# Patient Record
Sex: Female | Born: 1960 | Race: White | Hispanic: No | Smoking: Current every day smoker
Health system: Southern US, Community
[De-identification: ages and names within clinical notes are randomized; demographics above are authoritative.]

## PROBLEM LIST (undated history)

## (undated) DIAGNOSIS — F4310 Post-traumatic stress disorder, unspecified: Secondary | ICD-10-CM

## (undated) DIAGNOSIS — R748 Abnormal levels of other serum enzymes: Secondary | ICD-10-CM

## (undated) DIAGNOSIS — N183 Chronic kidney disease, stage 3 unspecified: Secondary | ICD-10-CM

## (undated) DIAGNOSIS — Z972 Presence of dental prosthetic device (complete) (partial): Secondary | ICD-10-CM

## (undated) DIAGNOSIS — Z1239 Encounter for other screening for malignant neoplasm of breast: Secondary | ICD-10-CM

## (undated) DIAGNOSIS — R011 Cardiac murmur, unspecified: Secondary | ICD-10-CM

## (undated) DIAGNOSIS — E785 Hyperlipidemia, unspecified: Secondary | ICD-10-CM

## (undated) DIAGNOSIS — F32A Depression, unspecified: Secondary | ICD-10-CM

## (undated) DIAGNOSIS — F329 Major depressive disorder, single episode, unspecified: Secondary | ICD-10-CM

## (undated) DIAGNOSIS — F410 Panic disorder [episodic paroxysmal anxiety] without agoraphobia: Secondary | ICD-10-CM

## (undated) DIAGNOSIS — J45909 Unspecified asthma, uncomplicated: Secondary | ICD-10-CM

## (undated) DIAGNOSIS — R0609 Other forms of dyspnea: Secondary | ICD-10-CM

## (undated) DIAGNOSIS — R072 Precordial pain: Secondary | ICD-10-CM

## (undated) DIAGNOSIS — H60399 Other infective otitis externa, unspecified ear: Secondary | ICD-10-CM

## (undated) DIAGNOSIS — K2940 Chronic atrophic gastritis without bleeding: Secondary | ICD-10-CM

## (undated) DIAGNOSIS — M25569 Pain in unspecified knee: Secondary | ICD-10-CM

## (undated) DIAGNOSIS — E669 Obesity, unspecified: Secondary | ICD-10-CM

## (undated) DIAGNOSIS — K219 Gastro-esophageal reflux disease without esophagitis: Secondary | ICD-10-CM

## (undated) DIAGNOSIS — R0602 Shortness of breath: Secondary | ICD-10-CM

## (undated) DIAGNOSIS — R7303 Prediabetes: Secondary | ICD-10-CM

## (undated) DIAGNOSIS — F411 Generalized anxiety disorder: Secondary | ICD-10-CM

## (undated) DIAGNOSIS — R55 Syncope and collapse: Secondary | ICD-10-CM

## (undated) DIAGNOSIS — I700 Atherosclerosis of aorta: Secondary | ICD-10-CM

## (undated) DIAGNOSIS — M79609 Pain in unspecified limb: Secondary | ICD-10-CM

## (undated) DIAGNOSIS — I10 Essential (primary) hypertension: Secondary | ICD-10-CM

## (undated) DIAGNOSIS — I340 Nonrheumatic mitral (valve) insufficiency: Secondary | ICD-10-CM

## (undated) DIAGNOSIS — R079 Chest pain, unspecified: Secondary | ICD-10-CM

## (undated) DIAGNOSIS — Z9889 Other specified postprocedural states: Secondary | ICD-10-CM

## (undated) HISTORY — DX: Cardiac murmur, unspecified: R01.1

## (undated) HISTORY — DX: Prediabetes: R73.03

## (undated) HISTORY — DX: Gastro-esophageal reflux disease without esophagitis: K21.9

## (undated) HISTORY — DX: Pain in unspecified knee: M25.569

## (undated) HISTORY — DX: Pain in unspecified limb: M79.609

## (undated) HISTORY — DX: Essential (primary) hypertension: I10

## (undated) HISTORY — DX: Major depressive disorder, single episode, unspecified: F32.9

## (undated) HISTORY — PX: SHOULDER SURGERY: SHX246

## (undated) HISTORY — DX: Generalized anxiety disorder: F41.1

## (undated) HISTORY — DX: Depression, unspecified: F32.A

## (undated) HISTORY — DX: Chronic kidney disease, stage 3 unspecified: N18.30

## (undated) HISTORY — DX: Unspecified asthma, uncomplicated: J45.909

## (undated) HISTORY — DX: Post-traumatic stress disorder, unspecified: F43.10

## (undated) HISTORY — DX: Atherosclerosis of aorta: I70.0

## (undated) HISTORY — DX: Encounter for other screening for malignant neoplasm of breast: Z12.39

## (undated) HISTORY — DX: Obesity, unspecified: E66.9

## (undated) HISTORY — DX: Shortness of breath: R06.02

## (undated) HISTORY — DX: Abnormal levels of other serum enzymes: R74.8

## (undated) HISTORY — DX: Syncope and collapse: R55

## (undated) HISTORY — DX: Chest pain, unspecified: R07.9

## (undated) HISTORY — DX: Other infective otitis externa, unspecified ear: H60.399

## (undated) HISTORY — DX: Chronic kidney disease, stage 3 (moderate): N18.3

## (undated) HISTORY — DX: Panic disorder (episodic paroxysmal anxiety): F41.0

## (undated) HISTORY — DX: Hyperlipidemia, unspecified: E78.5

## (undated) HISTORY — DX: Other specified postprocedural states: Z98.890

## (undated) HISTORY — PX: TOTAL ABDOMINAL HYSTERECTOMY: SHX209

## (undated) HISTORY — DX: Chronic atrophic gastritis without bleeding: K29.40

---

## 1998-04-03 HISTORY — PX: TOTAL ABDOMINAL HYSTERECTOMY: SHX209

## 2004-04-30 ENCOUNTER — Emergency Department: Payer: Self-pay | Admitting: Emergency Medicine

## 2006-12-10 ENCOUNTER — Emergency Department: Payer: Self-pay | Admitting: Emergency Medicine

## 2006-12-10 ENCOUNTER — Other Ambulatory Visit: Payer: Self-pay

## 2007-05-24 ENCOUNTER — Emergency Department: Payer: Self-pay | Admitting: Emergency Medicine

## 2008-08-06 ENCOUNTER — Ambulatory Visit: Payer: Self-pay | Admitting: Family Medicine

## 2009-01-05 ENCOUNTER — Ambulatory Visit: Payer: Self-pay

## 2009-12-17 ENCOUNTER — Emergency Department: Payer: Self-pay | Admitting: Emergency Medicine

## 2010-05-27 ENCOUNTER — Emergency Department: Payer: Self-pay | Admitting: Emergency Medicine

## 2010-06-01 ENCOUNTER — Ambulatory Visit: Payer: Self-pay | Admitting: Anesthesiology

## 2010-06-02 ENCOUNTER — Ambulatory Visit: Payer: Self-pay | Admitting: Orthopedic Surgery

## 2012-03-18 DIAGNOSIS — F32A Depression, unspecified: Secondary | ICD-10-CM | POA: Insufficient documentation

## 2012-03-18 DIAGNOSIS — F329 Major depressive disorder, single episode, unspecified: Secondary | ICD-10-CM | POA: Insufficient documentation

## 2012-03-18 DIAGNOSIS — I10 Essential (primary) hypertension: Secondary | ICD-10-CM

## 2012-03-18 DIAGNOSIS — J45909 Unspecified asthma, uncomplicated: Secondary | ICD-10-CM | POA: Insufficient documentation

## 2012-03-18 HISTORY — DX: Essential (primary) hypertension: I10

## 2012-04-04 DIAGNOSIS — B079 Viral wart, unspecified: Secondary | ICD-10-CM | POA: Insufficient documentation

## 2012-04-04 DIAGNOSIS — Z1239 Encounter for other screening for malignant neoplasm of breast: Secondary | ICD-10-CM

## 2012-04-04 DIAGNOSIS — R197 Diarrhea, unspecified: Secondary | ICD-10-CM | POA: Insufficient documentation

## 2012-04-04 HISTORY — DX: Encounter for other screening for malignant neoplasm of breast: Z12.39

## 2012-04-17 DIAGNOSIS — K130 Diseases of lips: Secondary | ICD-10-CM | POA: Insufficient documentation

## 2012-04-17 DIAGNOSIS — R21 Rash and other nonspecific skin eruption: Secondary | ICD-10-CM | POA: Insufficient documentation

## 2012-05-30 DIAGNOSIS — Z9889 Other specified postprocedural states: Secondary | ICD-10-CM

## 2012-05-30 HISTORY — DX: Other specified postprocedural states: Z98.890

## 2012-08-02 ENCOUNTER — Ambulatory Visit: Payer: Self-pay | Admitting: Nurse Practitioner

## 2013-08-14 ENCOUNTER — Encounter: Payer: Self-pay | Admitting: *Deleted

## 2013-08-15 ENCOUNTER — Encounter (INDEPENDENT_AMBULATORY_CARE_PROVIDER_SITE_OTHER): Payer: Self-pay

## 2013-08-15 ENCOUNTER — Ambulatory Visit: Payer: Medicaid Other | Admitting: Cardiovascular Disease

## 2013-08-28 ENCOUNTER — Ambulatory Visit: Payer: Self-pay | Admitting: Family Medicine

## 2013-09-03 LAB — HEMOGLOBIN A1C: Hgb A1c MFr Bld: 5.6 % (ref 4.0–6.0)

## 2013-10-16 ENCOUNTER — Encounter: Payer: Self-pay | Admitting: *Deleted

## 2013-10-17 ENCOUNTER — Encounter: Payer: Self-pay | Admitting: Cardiovascular Disease

## 2013-10-17 ENCOUNTER — Ambulatory Visit (INDEPENDENT_AMBULATORY_CARE_PROVIDER_SITE_OTHER): Payer: Medicaid Other | Admitting: Cardiovascular Disease

## 2013-10-17 ENCOUNTER — Encounter (INDEPENDENT_AMBULATORY_CARE_PROVIDER_SITE_OTHER): Payer: Self-pay

## 2013-10-17 VITALS — BP 110/74 | HR 71 | Ht 65.5 in | Wt 159.5 lb

## 2013-10-17 DIAGNOSIS — R011 Cardiac murmur, unspecified: Secondary | ICD-10-CM | POA: Insufficient documentation

## 2013-10-17 DIAGNOSIS — R079 Chest pain, unspecified: Secondary | ICD-10-CM

## 2013-10-17 DIAGNOSIS — Z720 Tobacco use: Secondary | ICD-10-CM | POA: Insufficient documentation

## 2013-10-17 DIAGNOSIS — E785 Hyperlipidemia, unspecified: Secondary | ICD-10-CM | POA: Insufficient documentation

## 2013-10-17 DIAGNOSIS — F17200 Nicotine dependence, unspecified, uncomplicated: Secondary | ICD-10-CM

## 2013-10-17 HISTORY — DX: Cardiac murmur, unspecified: R01.1

## 2013-10-17 NOTE — Assessment & Plan Note (Signed)
She has a cardiac murmur in the aortic area with no previous evaluation. I requested an echocardiogram.

## 2013-10-17 NOTE — Patient Instructions (Addendum)
[facility] MYOVIEW  Your caregiver has ordered a Stress Test with nuclear imaging. The purpose of this test is to evaluate the blood supply to your heart muscle. This procedure is referred to as a "Non-Invasive Stress Test." This is because other than having an IV started in your vein, nothing is inserted or "invades" your body. Cardiac stress tests are done to find areas of poor blood flow to the heart by determining the extent of coronary artery disease (CAD). Some patients exercise on a treadmill, which naturally increases the blood flow to your heart, while others who are  unable to walk on a treadmill due to physical limitations have a pharmacologic/chemical stress agent called Lexiscan . This medicine will mimic walking on a treadmill by temporarily increasing your coronary blood flow.   Please note: these test may take anywhere between 2-4 hours to complete  [facility]  THE VOLUNTEERS AT THE FIRST DESK WILL DIRECT YOU WHERE TO GO  Date of Procedure:_______07/22/15______________________________  Arrival Time for Procedure:_______0945 am_______________________      PLEASE NOTIFY THE OFFICE AT LEAST 24 HOURS IN ADVANCE IF YOU ARE UNABLE TO KEEP YOUR APPOINTMENT.  [phone] AND  PLEASE NOTIFY [facility] AT [facility] AT LEAST 24 HOURS IN ADVANCE IF YOU ARE UNABLE TO KEEP YOUR APPOINTMENT. [phone]  How to prepare for your Myoview test:  1. Do not eat or drink after midnight 2. No caffeine for 24 hours prior to test 3. No smoking 24 hours prior to test. 4. Your medication may be taken with water.  If your doctor stopped a medication because of this test, do not take that medication. 5. Ladies, please do not wear dresses.  Skirts or pants are appropriate. Please wear a short sleeve shirt. 6. No perfume, cologne or lotion. 7. Wear comfortable walking shoes. No heels!   Your physician has requested that you have an echocardiogram. Echocardiography is a  painless test that uses sound waves to create images of your heart. It provides your doctor with information about the size and shape of your heart and how well your heart's chambers and valves are working. This procedure takes approximately one hour. There are no restrictions for this procedure.  Your physician recommends that you schedule a follow-up appointment in:  After your tests with Dr. Kirke CorinArida

## 2013-10-17 NOTE — Progress Notes (Signed)
Primary care physician: Dr. Brayton ElSyed Shah  HPI  This is a pleasant 53 year old female who was referred for evaluation of chest pain. She has no previous cardiac history. She has no history of diabetes or hypertension. She reports severe hyperlipidemia which has been mostly untreated. She also smokes one pack per day. She has strong family history of premature coronary artery disease. She has been experiencing substernal aching sensation with no radiation. This is usually left sided. It has been associated with right arm tingling. It initially happened at night. However, she had recent episodes while she was mowing the lawn. She noticed worsening exertional dyspnea. She was started on atorvastatin recently but stopped the medication on her own due to very mild myalgia and concerns about side effects.  Allergies  Allergen Reactions  . Penicillins   . Sulfa Antibiotics      Current Outpatient Prescriptions on File Prior to Visit  Medication Sig Dispense Refill  . albuterol (PROVENTIL HFA;VENTOLIN HFA) 108 (90 BASE) MCG/ACT inhaler Inhale 1 puff into the lungs every 6 (six) hours as needed for wheezing or shortness of breath.      . ALPRAZolam (XANAX) 1 MG tablet Take 1.5 mg by mouth as needed.       . citalopram (CELEXA) 40 MG tablet Take 40 mg by mouth daily.      . mirtazapine (REMERON) 45 MG tablet Take 45 mg by mouth at bedtime.      Marland Kitchen. omeprazole (PRILOSEC) 40 MG capsule Take 40 mg by mouth daily.       No current facility-administered medications on file prior to visit.     Past Medical History  Diagnosis Date  . Depression   . Anxiety state, unspecified   . Other and unspecified hyperlipidemia   . Chronic kidney disease, stage III (moderate)   . Other nonspecific abnormal serum enzyme levels   . Undiagnosed cardiac murmurs   . Obesity, unspecified   . Infective otitis externa, unspecified   . 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 Pain in joint, lower leg   . Pain  in limb   . Panic disorder without agoraphobia   . Posttraumatic stress disorder   . Shortness of breath   . Chest pain, unspecified   . Anxiety state, unspecified   . Syncope and collapse   . Asthma      Past Surgical History  Procedure Laterality Date  . Total abdominal hysterectomy    . Shoulder surgery       Family History  Problem Relation Age of Onset  . Hypertension Mother   . COPD Mother   . Heart attack Mother   . Stroke Mother   . Heart disease Mother   . Hypertension Father   . Bone cancer Father   . Heart attack Father   . Stroke Father   . Heart disease Father   . Thyroid disease Sister      History   Social History  . Marital Status: Divorced    Spouse Name: N/A    Number of Children: N/A  . Years of Education: N/A   Occupational History  . Not on file.   Social History Main Topics  . Smoking status: Current Every Day Smoker -- 1.50 packs/day for 30 years    Types: Cigarettes  . Smokeless tobacco: Not on file  . Alcohol Use: No  . Drug Use: No  . Sexual Activity: Not on file   Other Topics Concern  . Not  on file   Social History Narrative  . No narrative on file     ROS A 10 point review of system was performed. It is negative other than that mentioned in the history of present illness.   PHYSICAL EXAM   BP 110/74  Pulse 71  Ht 5' 5.5" (1.664 m)  Wt 159 lb 8 oz (72.349 kg)  BMI 26.13 kg/m2 Constitutional: She is oriented to person, place, and time. She appears well-developed and well-nourished. No distress.  HENT: No nasal discharge.  Head: Normocephalic and atraumatic.  Eyes: Pupils are equal and round. No discharge.  Neck: Normal range of motion. Neck supple. No JVD present. No thyromegaly present.  Cardiovascular: Normal rate, regular rhythm, normal heart sounds. Exam reveals no gallop and no friction rub. There is a 2/6 systolic ejection murmur in the aortic area  Pulmonary/Chest: Effort normal and breath sounds normal.  No stridor. No respiratory distress. She has no wheezes. She has no rales. She exhibits no tenderness.  Abdominal: Soft. Bowel sounds are normal. She exhibits no distension. There is no tenderness. There is no rebound and no guarding.  Musculoskeletal: Normal range of motion. She exhibits no edema and no tenderness.  Neurological: She is alert and oriented to person, place, and time. Coordination normal.  Skin: Skin is warm and dry. No rash noted. She is not diaphoretic. No erythema. No pallor.  Psychiatric: She has a normal mood and affect. Her behavior is normal. Judgment and thought content normal.     EKG: Normal sinus rhythm with nonspecific anterior T wave changes   ASSESSMENT AND PLAN

## 2013-10-17 NOTE — Assessment & Plan Note (Signed)
She is going to resume taking atorvastatin. If she is not able to tolerate this, then I recommend rosuvastatin.

## 2013-10-17 NOTE — Assessment & Plan Note (Signed)
She is having chest pain with some anginal and some atypical features. She has multiple risk factors for coronary artery disease. Baseline ECG shows flat T waves in the anterior leads. I recommend evaluation with a treadmill nuclear stress test. I discussed with the patient the importance of lifestyle changes in order to decrease the chance of future coronary artery disease and cardiovascular events. We discussed the importance of controlling risk factors, healthy diet as well as regular exercise. I also explained to him that a normal stress test does not rule out atherosclerosis.

## 2013-10-17 NOTE — Assessment & Plan Note (Signed)
I strongly advised her to quit smoking. 

## 2013-10-22 ENCOUNTER — Ambulatory Visit: Payer: Self-pay | Admitting: Cardiovascular Disease

## 2013-10-22 DIAGNOSIS — R079 Chest pain, unspecified: Secondary | ICD-10-CM

## 2013-10-23 ENCOUNTER — Other Ambulatory Visit: Payer: Self-pay

## 2013-10-23 DIAGNOSIS — R079 Chest pain, unspecified: Secondary | ICD-10-CM

## 2013-11-04 ENCOUNTER — Ambulatory Visit: Payer: Medicaid Other | Admitting: Cardiovascular Disease

## 2013-11-18 ENCOUNTER — Other Ambulatory Visit (INDEPENDENT_AMBULATORY_CARE_PROVIDER_SITE_OTHER): Payer: Medicare Other

## 2013-11-18 ENCOUNTER — Other Ambulatory Visit: Payer: Self-pay

## 2013-11-18 DIAGNOSIS — R079 Chest pain, unspecified: Secondary | ICD-10-CM

## 2013-11-18 DIAGNOSIS — R011 Cardiac murmur, unspecified: Secondary | ICD-10-CM | POA: Diagnosis not present

## 2014-01-20 DIAGNOSIS — Z23 Encounter for immunization: Secondary | ICD-10-CM | POA: Diagnosis not present

## 2014-01-20 DIAGNOSIS — E785 Hyperlipidemia, unspecified: Secondary | ICD-10-CM | POA: Diagnosis not present

## 2014-01-20 DIAGNOSIS — Z1389 Encounter for screening for other disorder: Secondary | ICD-10-CM | POA: Diagnosis not present

## 2014-02-12 DIAGNOSIS — F4310 Post-traumatic stress disorder, unspecified: Secondary | ICD-10-CM | POA: Diagnosis not present

## 2014-04-07 DIAGNOSIS — F4310 Post-traumatic stress disorder, unspecified: Secondary | ICD-10-CM | POA: Diagnosis not present

## 2014-05-05 DIAGNOSIS — F4310 Post-traumatic stress disorder, unspecified: Secondary | ICD-10-CM | POA: Diagnosis not present

## 2014-05-11 DIAGNOSIS — F4310 Post-traumatic stress disorder, unspecified: Secondary | ICD-10-CM | POA: Diagnosis not present

## 2014-06-04 DIAGNOSIS — F4310 Post-traumatic stress disorder, unspecified: Secondary | ICD-10-CM | POA: Diagnosis not present

## 2014-06-05 DIAGNOSIS — E785 Hyperlipidemia, unspecified: Secondary | ICD-10-CM | POA: Diagnosis not present

## 2014-06-05 LAB — LIPID PANEL
Cholesterol: 266 mg/dL — AB (ref 0–200)
HDL: 35 mg/dL (ref 35–70)
LDL Cholesterol: 182 mg/dL
Triglycerides: 244 mg/dL — AB (ref 40–160)

## 2014-07-28 DIAGNOSIS — F4310 Post-traumatic stress disorder, unspecified: Secondary | ICD-10-CM | POA: Diagnosis not present

## 2014-08-17 DIAGNOSIS — F4310 Post-traumatic stress disorder, unspecified: Secondary | ICD-10-CM | POA: Diagnosis not present

## 2014-09-01 DIAGNOSIS — F4310 Post-traumatic stress disorder, unspecified: Secondary | ICD-10-CM | POA: Diagnosis not present

## 2014-09-08 ENCOUNTER — Ambulatory Visit: Payer: Self-pay | Admitting: Family Medicine

## 2014-09-16 ENCOUNTER — Ambulatory Visit (INDEPENDENT_AMBULATORY_CARE_PROVIDER_SITE_OTHER): Payer: Medicare Other | Admitting: Family Medicine

## 2014-09-16 ENCOUNTER — Encounter: Payer: Self-pay | Admitting: Family Medicine

## 2014-09-16 VITALS — BP 110/60 | HR 96 | Resp 16 | Ht 64.5 in | Wt 161.6 lb

## 2014-09-16 DIAGNOSIS — K219 Gastro-esophageal reflux disease without esophagitis: Secondary | ICD-10-CM | POA: Insufficient documentation

## 2014-09-16 DIAGNOSIS — J4520 Mild intermittent asthma, uncomplicated: Secondary | ICD-10-CM | POA: Diagnosis not present

## 2014-09-16 DIAGNOSIS — F4312 Post-traumatic stress disorder, chronic: Secondary | ICD-10-CM | POA: Insufficient documentation

## 2014-09-16 DIAGNOSIS — R748 Abnormal levels of other serum enzymes: Secondary | ICD-10-CM | POA: Insufficient documentation

## 2014-09-16 DIAGNOSIS — K449 Diaphragmatic hernia without obstruction or gangrene: Secondary | ICD-10-CM | POA: Insufficient documentation

## 2014-09-16 DIAGNOSIS — E785 Hyperlipidemia, unspecified: Secondary | ICD-10-CM | POA: Diagnosis not present

## 2014-09-16 DIAGNOSIS — F419 Anxiety disorder, unspecified: Secondary | ICD-10-CM | POA: Insufficient documentation

## 2014-09-16 HISTORY — DX: Gastro-esophageal reflux disease without esophagitis: K21.9

## 2014-09-16 MED ORDER — ALBUTEROL SULFATE HFA 108 (90 BASE) MCG/ACT IN AERS: 2.0000 | INHALATION_SPRAY | Freq: Four times a day (QID) | RESPIRATORY_TRACT | Status: DC | PRN

## 2014-09-16 NOTE — Progress Notes (Signed)
Name: Ann Pena   MRN: [id number]    DOB: May 04, 1960   Date:09/16/2014       Progress Note  Subjective  Chief Complaint  Chief Complaint  Patient presents with  . Follow-up    3 month follow up  . Hyperlipidemia    Hyperlipidemia This is a chronic problem. The current episode started more than 1 year ago. The problem is uncontrolled. Recent lipid tests were reviewed and are high. Associated symptoms include leg pain and myalgias. Pertinent negatives include no chest pain. Current antihyperlipidemic treatment includes statins. Compliance problems include medication side effects (not taking Crestor due to adverse effects of fatigue  and sluggishness with myalgias.).  Risk factors for coronary artery disease include dyslipidemia.      Past Medical History  Diagnosis Date  . Depression   . Anxiety state, unspecified   . Chronic kidney disease, stage III (moderate)   . Other nonspecific abnormal serum enzyme levels   . Undiagnosed cardiac murmurs   . Obesity, unspecified   . Infective otitis externa, unspecified   . 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 Pain in joint, lower leg   . Pain in limb   . Panic disorder without agoraphobia   . Posttraumatic stress disorder   . Shortness of breath   . Chest pain, unspecified   . Anxiety state, unspecified   . Syncope and collapse   . Asthma   . Other and unspecified hyperlipidemia     Past Surgical History  Procedure Laterality Date  . Total abdominal hysterectomy    . Shoulder surgery      Family History  Problem Relation Age of Onset  . Hypertension Mother   . COPD Mother   . Heart attack Mother   . Stroke Mother   . Heart disease Mother   . Hypertension Father   . Bone cancer Father   . Heart attack Father   . Stroke Father   . Heart disease Father   . Thyroid disease Sister     History   Social History  . Marital Status: Divorced    Spouse Name: N/A  . Number of Children: N/A  . Years  of Education: N/A   Occupational History  . Not on file.   Social History Main Topics  . Smoking status: Current Every Day Smoker -- 1.50 packs/day for 30 years    Types: Cigarettes  . Smokeless tobacco: Not on file  . Alcohol Use: No  . Drug Use: No  . Sexual Activity: Not on file   Other Topics Concern  . Not on file   Social History Narrative     Current outpatient prescriptions:  .  albuterol (PROAIR HFA) 108 (90 BASE) MCG/ACT inhaler, Inhale into the lungs as needed., Disp: , Rfl:  .  ALPRAZolam (NIRAVAM) 1 MG dissolvable tablet, Take 1.5 tablets by mouth 4 (four) times daily., Disp: , Rfl:  .  citalopram (CELEXA) 40 MG tablet, Take 1 tablet by mouth daily., Disp: , Rfl:  .  omeprazole (PRILOSEC) 40 MG capsule, Take 1 capsule by mouth daily., Disp: , Rfl:  .  rosuvastatin (CRESTOR) 20 MG tablet, Take 1 tablet by mouth daily., Disp: , Rfl:  .  atorvastatin (LIPITOR) 20 MG tablet, Take 20 mg by mouth daily., Disp: , Rfl:  .  mirtazapine (REMERON) 45 MG tablet, Take 1 tablet by mouth at bedtime., Disp: , Rfl:   Allergies  Allergen Reactions  . Penicillins   .  Sulfa Antibiotics      Review of Systems  Constitutional: Positive for malaise/fatigue.  Cardiovascular: Negative for chest pain.  Musculoskeletal: Positive for myalgias.     Objective  Filed Vitals:   09/16/14 0904  BP: 110/60  Pulse: 96  Resp: 16  Height: 5' 4.5" (1.638 m)  Weight: 161 lb 9.6 oz (73.301 kg)  SpO2: 96%    Physical Exam  Constitutional: She is oriented to person, place, and time and well-developed, well-nourished, and in no distress.  HENT:  Head: Normocephalic and atraumatic.  Cardiovascular: Normal rate and regular rhythm.   Pulmonary/Chest: Effort normal. She has wheezes in the right lower field and the left lower field.  Neurological: She is alert and oriented to person, place, and time.  Psychiatric: Memory, affect and judgment normal.  Nursing note and vitals  reviewed.      No results found for this or any previous visit (from the past 2160 hour(s)).   Assessment & Plan 1. Dyslipidemia We will consider starting her on Zetia  daily after checking her Lipid Panel. - Lipid Profile  2. Airway hyperreactivity, mild intermittent, uncomplicated Recommended pt. To use ProAir as directed for wheezing. She has never been worked up for COPD. If her symptoms persist, we will consider referral to Pulmonology for evaluation. - albuterol (PROAIR HFA) 108 (90 BASE) MCG/ACT inhaler; Inhale 2 puffs into the lungs every 6 (six) hours as needed.  Dispense: 6.7 g; Refill: 1     There are no diagnoses linked to this encounter.  Ventura Hollenbeck Asad A[facility] 09/16/2014 9:29 AM

## 2014-09-17 LAB — LIPID PANEL
Chol/HDL Ratio: 8.7 ratio units — ABNORMAL HIGH (ref 0.0–4.4)
Cholesterol, Total: 286 mg/dL — ABNORMAL HIGH (ref 100–199)
HDL: 33 mg/dL — ABNORMAL LOW (ref >39)
LDL Calculated: 195 mg/dL — ABNORMAL HIGH (ref 0–99)
Triglycerides: 290 mg/dL — ABNORMAL HIGH (ref 0–149)
VLDL Cholesterol Cal: 58 mg/dL — ABNORMAL HIGH (ref 5–40)

## 2014-09-18 MED ORDER — EZETIMIBE 10 MG PO TABS
10.0000 mg | ORAL_TABLET | Freq: Every day | ORAL | Status: DC
Start: 2014-09-18 — End: 2015-04-27

## 2014-09-18 NOTE — Addendum Note (Signed)
Addended bySherryll Burger, Shevon Sian A A on: 09/18/2014 12:02 PM   Modules accepted: Orders, SmartSet

## 2014-10-01 DIAGNOSIS — F4310 Post-traumatic stress disorder, unspecified: Secondary | ICD-10-CM | POA: Diagnosis not present

## 2014-12-18 ENCOUNTER — Ambulatory Visit: Payer: Medicare Other | Admitting: Family Medicine

## 2015-01-01 DIAGNOSIS — F4310 Post-traumatic stress disorder, unspecified: Secondary | ICD-10-CM | POA: Diagnosis not present

## 2015-01-29 DIAGNOSIS — F4310 Post-traumatic stress disorder, unspecified: Secondary | ICD-10-CM | POA: Diagnosis not present

## 2015-03-02 DIAGNOSIS — F4310 Post-traumatic stress disorder, unspecified: Secondary | ICD-10-CM | POA: Diagnosis not present

## 2015-03-22 DIAGNOSIS — F322 Major depressive disorder, single episode, severe without psychotic features: Secondary | ICD-10-CM | POA: Diagnosis not present

## 2015-04-27 ENCOUNTER — Ambulatory Visit (INDEPENDENT_AMBULATORY_CARE_PROVIDER_SITE_OTHER): Payer: Medicare Other | Admitting: Family Medicine

## 2015-04-27 ENCOUNTER — Encounter: Payer: Self-pay | Admitting: Family Medicine

## 2015-04-27 VITALS — BP 110/77 | HR 101 | Temp 98.1°F | Resp 20 | Ht 65.0 in | Wt 152.0 lb

## 2015-04-27 DIAGNOSIS — K219 Gastro-esophageal reflux disease without esophagitis: Secondary | ICD-10-CM

## 2015-04-27 DIAGNOSIS — E785 Hyperlipidemia, unspecified: Secondary | ICD-10-CM | POA: Diagnosis not present

## 2015-04-27 MED ORDER — OMEPRAZOLE 40 MG PO CPDR: 40.0000 mg | DELAYED_RELEASE_CAPSULE | Freq: Every day | ORAL | Status: DC

## 2015-04-27 MED ORDER — EZETIMIBE 10 MG PO TABS: 10.0000 mg | ORAL_TABLET | Freq: Every day | ORAL | Status: DC

## 2015-04-27 NOTE — Progress Notes (Signed)
Name: Ann Pena   MRN: [id number]    DOB: [birth year]   Date:04/27/2015       Progress Note  Subjective  Chief Complaint  Chief Complaint  Patient presents with  . Follow-up    cholesterol check  . Hyperlipidemia  . Asthma    Hyperlipidemia This is a chronic problem. The problem is uncontrolled. Recent lipid tests were reviewed and are high. Pertinent negatives include no chest pain or shortness of breath. Current antihyperlipidemic treatment includes ezetimibe (Zetia 10 mg daily). Compliance problems: Ran out of Zetia, has not taken in over 6 months.   Gastroesophageal Reflux She reports no abdominal pain, no belching, no chest pain, no coughing, no dysphagia, no heartburn or no sore throat. This is a chronic problem. The problem has been unchanged. The symptoms are aggravated by certain foods (chocolate, spicy foods, greasy foods). She has tried a PPI for the symptoms. The treatment provided significant relief.    Past Medical History  Diagnosis Date  . Depression   . Anxiety state, unspecified   . Chronic kidney disease, stage III (moderate)   . Other nonspecific abnormal serum enzyme levels   . Undiagnosed cardiac murmurs   . Obesity, unspecified   . Infective otitis externa, unspecified   . 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 Pain in joint, lower leg   . Pain in limb   . Panic disorder without agoraphobia   . Posttraumatic stress disorder   . Shortness of breath   . Chest pain, unspecified   . Anxiety state, unspecified   . Syncope and collapse   . Asthma   . Other and unspecified hyperlipidemia     Past Surgical History  Procedure Laterality Date  . Total abdominal hysterectomy    . Shoulder surgery      Family History  Problem Relation Age of Onset  . Hypertension Mother   . COPD Mother   . Heart attack Mother   . Stroke Mother   . Heart disease Mother   . Hypertension Father   . Bone cancer Father   . Heart attack Father   .  Stroke Father   . Heart disease Father   . Thyroid disease Sister     Social History   Social History  . Marital Status: Divorced    Spouse Name: N/A  . Number of Children: N/A  . Years of Education: N/A   Occupational History  . Not on file.   Social History Main Topics  . Smoking status: Current Every Day Smoker -- 1.50 packs/day for 30 years    Types: Cigarettes  . Smokeless tobacco: Not on file  . Alcohol Use: No  . Drug Use: No  . Sexual Activity: Not on file   Other Topics Concern  . Not on file   Social History Narrative     Current outpatient prescriptions:  .  albuterol (PROAIR HFA) 108 (90 BASE) MCG/ACT inhaler, Inhale 2 puffs into the lungs every 6 (six) hours as needed., Disp: 6.7 g, Rfl: 1 .  ALPRAZolam (NIRAVAM) 1 MG dissolvable tablet, Take 1.5 tablets by mouth 4 (four) times daily., Disp: , Rfl:  .  ALPRAZolam (XANAX) 1 MG tablet, , Disp: , Rfl:  .  citalopram (CELEXA) 40 MG tablet, Take 1 tablet by mouth daily., Disp: , Rfl:  .  ezetimibe (ZETIA) 10 MG tablet, Take 1 tablet (10 mg total) by mouth daily., Disp: 90 tablet, Rfl: 3 .  mirtazapine (REMERON) 45  MG tablet, Take 1 tablet by mouth at bedtime., Disp: , Rfl:  .  omeprazole (PRILOSEC) 40 MG capsule, Take 1 capsule by mouth daily., Disp: , Rfl:   Allergies  Allergen Reactions  . Penicillins   . Sulfa Antibiotics     Review of Systems  HENT: Negative for sore throat.   Respiratory: Negative for cough and shortness of breath.   Cardiovascular: Negative for chest pain.  Gastrointestinal: Positive for diarrhea. Negative for heartburn, dysphagia, abdominal pain and blood in stool.     Objective  Filed Vitals:   04/27/15 0925  BP: 110/77  Pulse: 101  Temp: 98.1 F (36.7 C)  TempSrc: Oral  Resp: 20  Height:  (1.651 m)  Weight: 152 lb (68.947 kg)  SpO2: 96%    Physical Exam  Constitutional: She is oriented to person, place, and time and well-developed, well-nourished, and in no  distress.  HENT:  Head: Normocephalic and atraumatic.  Cardiovascular: Normal rate and regular rhythm.   No murmur heard. Pulmonary/Chest: Effort normal and breath sounds normal.  Neurological: She is alert and oriented to person, place, and time.  Psychiatric: Mood, memory, affect and judgment normal.  Nursing note and vitals reviewed.   Assessment & Plan  1. HLD (hyperlipidemia)  - Lipid Profile - ezetimibe (ZETIA) 10 MG tablet; Take 1 tablet (10 mg total) by mouth daily.  Dispense: 90 tablet; Refill: 1 - Lipid Profile - Comprehensive Metabolic Panel (CMET)  2. Gastroesophageal reflux disease, esophagitis presence not specified  - omeprazole (PRILOSEC) 40 MG capsule; Take 1 capsule (40 mg total) by mouth daily.  Dispense: 90 capsule; Refill: 0   Pearla Mckinny Asad [facility] 04/27/2015 9:53 AM

## 2015-04-28 LAB — LIPID PANEL
Chol/HDL Ratio: 7.3 ratio units — ABNORMAL HIGH (ref 0.0–4.4)
Cholesterol, Total: 279 mg/dL — ABNORMAL HIGH (ref 100–199)
HDL: 38 mg/dL — ABNORMAL LOW (ref >39)
LDL Calculated: 212 mg/dL — ABNORMAL HIGH (ref 0–99)
Triglycerides: 147 mg/dL (ref 0–149)
VLDL Cholesterol Cal: 29 mg/dL (ref 5–40)

## 2015-04-28 LAB — COMPREHENSIVE METABOLIC PANEL
ALT: 6 IU/L (ref 0–32)
AST: 15 IU/L (ref 0–40)
Albumin/Globulin Ratio: 1.5 (ref 1.1–2.5)
Albumin: 4.3 g/dL (ref 3.5–5.5)
Alkaline Phosphatase: 126 IU/L — ABNORMAL HIGH (ref 39–117)
BUN/Creatinine Ratio: 8 — ABNORMAL LOW (ref 9–23)
BUN: 8 mg/dL (ref 6–24)
Bilirubin Total: 0.3 mg/dL (ref 0.0–1.2)
CO2: 26 mmol/L (ref 18–29)
Calcium: 9.5 mg/dL (ref 8.7–10.2)
Chloride: 99 mmol/L (ref 96–106)
Creatinine, Ser: 0.99 mg/dL (ref 0.57–1.00)
GFR calc Af Amer: 75 mL/min/{1.73_m2} (ref >59)
GFR calc non Af Amer: 65 mL/min/{1.73_m2} (ref >59)
Globulin, Total: 2.8 g/dL (ref 1.5–4.5)
Glucose: 89 mg/dL (ref 65–99)
Potassium: 4.3 mmol/L (ref 3.5–5.2)
Sodium: 140 mmol/L (ref 134–144)
Total Protein: 7.1 g/dL (ref 6.0–8.5)

## 2015-05-10 ENCOUNTER — Telehealth: Payer: Self-pay | Admitting: Family Medicine

## 2015-05-10 NOTE — Telephone Encounter (Signed)
Pt states she got labs done a little over a week ago and has not gotten her results yet. Please advise pt.

## 2015-05-11 NOTE — Telephone Encounter (Signed)
Returned patient's and labs have been re-reported to patient

## 2015-05-13 DIAGNOSIS — F4310 Post-traumatic stress disorder, unspecified: Secondary | ICD-10-CM | POA: Diagnosis not present

## 2015-05-26 ENCOUNTER — Encounter: Payer: Self-pay | Admitting: Family Medicine

## 2015-05-26 ENCOUNTER — Ambulatory Visit (INDEPENDENT_AMBULATORY_CARE_PROVIDER_SITE_OTHER): Payer: Medicare Other | Admitting: Family Medicine

## 2015-05-26 ENCOUNTER — Ambulatory Visit
Admission: RE | Admit: 2015-05-26 | Discharge: 2015-05-26 | Disposition: A | Discharge status: Home / Self Care | Payer: Medicare Other | Source: Ambulatory Visit | Attending: Family Medicine | Admitting: Family Medicine

## 2015-05-26 VITALS — BP 112/84 | HR 106 | Temp 98.1°F | Resp 16 | Ht 65.0 in | Wt 152.8 lb

## 2015-05-26 DIAGNOSIS — S069X0A Unspecified intracranial injury without loss of consciousness, initial encounter: Secondary | ICD-10-CM | POA: Diagnosis not present

## 2015-05-26 DIAGNOSIS — R51 Headache: Secondary | ICD-10-CM | POA: Diagnosis not present

## 2015-05-26 DIAGNOSIS — H9313 Tinnitus, bilateral: Secondary | ICD-10-CM | POA: Diagnosis not present

## 2015-05-26 DIAGNOSIS — G44319 Acute post-traumatic headache, not intractable: Secondary | ICD-10-CM | POA: Diagnosis not present

## 2015-05-26 DIAGNOSIS — H9319 Tinnitus, unspecified ear: Secondary | ICD-10-CM | POA: Insufficient documentation

## 2015-05-26 NOTE — Progress Notes (Signed)
Name: Ann Pena   MRN: [id number]    DOB: 1961-02-01   Date:05/26/2015       Progress Note  Subjective  Chief Complaint  Chief Complaint  Patient presents with  . Head Injury    Hit head on cabinet 1 week ago, Dizzy, ringing in ears, seen spots  out of corner of eyes with headache    Head Injury  Incident onset: 11 days ago. Injury mechanism: Hit her head on a wooden cabinet while picking up an object. There was no loss of consciousness. There was no blood loss. The quality of the pain is described as sharp (had a sharp headache every day since the incident). Associated symptoms include headaches and tinnitus (initially dizziness last week, now experiencing 'roaring' in her ears.). Pertinent negatives include no blurred vision (experienced 'raindrops' from her left eye) or disorientation. She has tried acetaminophen for the symptoms. The treatment provided moderate relief.    Past Medical History  Diagnosis Date  . Depression   . Anxiety state, unspecified   . Chronic kidney disease, stage III (moderate)   . Other nonspecific abnormal serum enzyme levels   . Undiagnosed cardiac murmurs   . Obesity, unspecified   . Infective otitis externa, unspecified   . 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 Pain in joint, lower leg   . Pain in limb   . Panic disorder without agoraphobia   . Posttraumatic stress disorder   . Shortness of breath   . Chest pain, unspecified   . Anxiety state, unspecified   . Syncope and collapse   . Asthma   . Other and unspecified hyperlipidemia     Past Surgical History  Procedure Laterality Date  . Total abdominal hysterectomy    . Shoulder surgery      Family History  Problem Relation Age of Onset  . Hypertension Mother   . COPD Mother   . Heart attack Mother   . Stroke Mother   . Heart disease Mother   . Hypertension Father   . Bone cancer Father   . Heart attack Father   . Stroke Father   . Heart disease Father   .  Thyroid disease Sister     Social History   Social History  . Marital Status: Divorced    Spouse Name: N/A  . Number of Children: N/A  . Years of Education: N/A   Occupational History  . Not on file.   Social History Main Topics  . Smoking status: Current Every Day Smoker -- 1.50 packs/day for 30 years    Types: Cigarettes  . Smokeless tobacco: Not on file  . Alcohol Use: No  . Drug Use: No  . Sexual Activity: Not on file   Other Topics Concern  . Not on file   Social History Narrative     Current outpatient prescriptions:  .  albuterol (PROAIR HFA) 108 (90 BASE) MCG/ACT inhaler, Inhale 2 puffs into the lungs every 6 (six) hours as needed., Disp: 6.7 g, Rfl: 1 .  ALPRAZolam (XANAX) 1 MG tablet, , Disp: , Rfl:  .  citalopram (CELEXA) 40 MG tablet, Take 1 tablet by mouth daily., Disp: , Rfl:  .  ezetimibe (ZETIA) 10 MG tablet, Take 1 tablet (10 mg total) by mouth daily., Disp: 90 tablet, Rfl: 1 .  mirtazapine (REMERON) 45 MG tablet, Take 1 tablet by mouth at bedtime., Disp: , Rfl:  .  omeprazole (PRILOSEC) 40 MG capsule, Take 1 capsule (  40 mg total) by mouth daily., Disp: 90 capsule, Rfl: 0  Allergies  Allergen Reactions  . Penicillins   . Sulfa Antibiotics      Review of Systems  HENT: Positive for tinnitus (initially dizziness last week, now experiencing 'roaring' in her ears.). Negative for ear pain and hearing loss.   Eyes: Negative for blurred vision (experienced 'raindrops' from her left eye) and double vision.  Neurological: Positive for headaches. Negative for dizziness.    Objective  Filed Vitals:   05/26/15 1101  BP: 112/84  Pulse: 106  Temp: 98.1 F (36.7 C)  TempSrc: Oral  Resp: 16  Height:  (1.651 m)  Weight: 152 lb 12.8 oz (69.31 kg)  SpO2: 97%    Physical Exam  Constitutional: She is oriented to person, place, and time and well-developed, well-nourished, and in no distress.  HENT:  Head: Normocephalic and atraumatic. Head is  without contusion.  Right Ear: Tympanic membrane, external ear and ear canal normal.  Left Ear: Tympanic membrane, external ear and ear canal normal.  Mouth/Throat: Posterior oropharyngeal erythema present.  TM normal, canal clear, non erythematous, however pt. Experienced pain and 'ocean waves' in both ears during otoscopic exam.  Eyes: EOM are normal. Pupils are equal, round, and reactive to light.  Cardiovascular: Normal rate, regular rhythm, S1 normal and S2 normal.   Pulmonary/Chest: Effort normal and breath sounds normal.  Neurological: She is alert and oriented to person, place, and time. She has intact cranial nerves. No cranial nerve deficit.  Patient was not able to hear a finger rub in both ears, however, she was able to hear my voice and converse normally during the exam.  Nursing note and vitals reviewed.   Assessment & Plan  1. Acute post-traumatic headache, not intractable Headache improving, and obtain CT to rule out any acute process especially in light of auditory and visual symptoms experienced by the patient. - CT Head Wo Contrast; Future  2. Tinnitus, subjective, bilateral Ear canals and TMs are normal, will refer to ENT for further evaluation - Ambulatory referral to ENT   Annibelle Brazie Asad A. [facility] 05/26/2015 11:17 AM

## 2015-06-03 DIAGNOSIS — F4310 Post-traumatic stress disorder, unspecified: Secondary | ICD-10-CM | POA: Diagnosis not present

## 2015-06-10 DIAGNOSIS — H903 Sensorineural hearing loss, bilateral: Secondary | ICD-10-CM | POA: Diagnosis not present

## 2015-06-10 DIAGNOSIS — H9313 Tinnitus, bilateral: Secondary | ICD-10-CM | POA: Diagnosis not present

## 2015-07-08 DIAGNOSIS — F4310 Post-traumatic stress disorder, unspecified: Secondary | ICD-10-CM | POA: Diagnosis not present

## 2015-07-26 ENCOUNTER — Ambulatory Visit (INDEPENDENT_AMBULATORY_CARE_PROVIDER_SITE_OTHER): Payer: Medicare Other | Admitting: Family Medicine

## 2015-07-26 ENCOUNTER — Encounter: Payer: Self-pay | Admitting: Family Medicine

## 2015-07-26 VITALS — BP 110/78 | HR 92 | Temp 98.1°F | Resp 18 | Ht 65.0 in | Wt 160.9 lb

## 2015-07-26 DIAGNOSIS — K219 Gastro-esophageal reflux disease without esophagitis: Secondary | ICD-10-CM | POA: Diagnosis not present

## 2015-07-26 DIAGNOSIS — E785 Hyperlipidemia, unspecified: Secondary | ICD-10-CM | POA: Diagnosis not present

## 2015-07-26 MED ORDER — OMEPRAZOLE 40 MG PO CPDR: 40.0000 mg | DELAYED_RELEASE_CAPSULE | Freq: Every day | ORAL | Status: DC

## 2015-07-26 NOTE — Progress Notes (Signed)
Name: Ann Pena   MRN: [id number]    DOB: July 08, 1960   Date:07/26/2015       Progress Note  Subjective  Chief Complaint  Chief Complaint  Patient presents with  . Follow-up    3 mo    Hyperlipidemia This is a chronic problem. The problem is uncontrolled. Recent lipid tests were reviewed and are high. Pertinent negatives include no chest pain or shortness of breath. Current antihyperlipidemic treatment includes ezetimibe (Zetia 10 mg daily). There are no compliance problems.   Gastroesophageal Reflux She reports no abdominal pain, no belching, no chest pain, no coughing, no dysphagia, no heartburn or no sore throat. This is a chronic problem. The problem has been unchanged. The symptoms are aggravated by certain foods (chocolate, spicy foods, greasy foods). She has tried a PPI for the symptoms. The treatment provided significant relief.    Past Medical History  Diagnosis Date  . Depression   . Anxiety state, unspecified   . Chronic kidney disease, stage III (moderate)   . Other nonspecific abnormal serum enzyme levels   . Undiagnosed cardiac murmurs   . Obesity, unspecified   . Infective otitis externa, unspecified   . 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 Pain in joint, lower leg   . Pain in limb   . Panic disorder without agoraphobia   . Posttraumatic stress disorder   . Shortness of breath   . Chest pain, unspecified   . Anxiety state, unspecified   . Syncope and collapse   . Asthma   . Other and unspecified hyperlipidemia     Past Surgical History  Procedure Laterality Date  . Total abdominal hysterectomy    . Shoulder surgery      Family History  Problem Relation Age of Onset  . Hypertension Mother   . COPD Mother   . Heart attack Mother   . Stroke Mother   . Heart disease Mother   . Hypertension Father   . Bone cancer Father   . Heart attack Father   . Stroke Father   . Heart disease Father   . Thyroid disease Sister     Social  History   Social History  . Marital Status: Divorced    Spouse Name: N/A  . Number of Children: N/A  . Years of Education: N/A   Occupational History  . Not on file.   Social History Main Topics  . Smoking status: Current Every Day Smoker -- 1.50 packs/day for 30 years    Types: Cigarettes  . Smokeless tobacco: Not on file  . Alcohol Use: No  . Drug Use: No  . Sexual Activity: Not on file   Other Topics Concern  . Not on file   Social History Narrative     Current outpatient prescriptions:  .  albuterol (PROAIR HFA) 108 (90 BASE) MCG/ACT inhaler, Inhale 2 puffs into the lungs every 6 (six) hours as needed., Disp: 6.7 g, Rfl: 1 .  ALPRAZolam (XANAX) 1 MG tablet, , Disp: , Rfl:  .  citalopram (CELEXA) 40 MG tablet, Take 1 tablet by mouth daily., Disp: , Rfl:  .  ezetimibe (ZETIA) 10 MG tablet, Take 1 tablet (10 mg total) by mouth daily., Disp: 90 tablet, Rfl: 1 .  mirtazapine (REMERON) 45 MG tablet, Take 1 tablet by mouth at bedtime., Disp: , Rfl:  .  omeprazole (PRILOSEC) 40 MG capsule, Take 1 capsule (40 mg total) by mouth daily., Disp: 90 capsule, Rfl: 0  Allergies  Allergen Reactions  . Penicillins   . Sulfa Antibiotics      Review of Systems  HENT: Negative for sore throat.   Respiratory: Negative for cough and shortness of breath.   Cardiovascular: Negative for chest pain.  Gastrointestinal: Negative for heartburn, dysphagia and abdominal pain.    Objective  Filed Vitals:   07/26/15 1122  BP: 110/78  Pulse: 92  Temp: 98.1 F (36.7 C)  TempSrc: Oral  Resp: 18  Height: 5\' 5"  (1.651 m)  Weight: 160 lb 14.4 oz (72.984 kg)  SpO2: 96%    Physical Exam  Constitutional: She is oriented to person, place, and time and well-developed, well-nourished, and in no distress.  HENT:  Head: Normocephalic and atraumatic.  Cardiovascular: Normal rate and regular rhythm.   Pulmonary/Chest: Effort normal and breath sounds normal.  Abdominal: Soft. Bowel sounds are  normal. There is tenderness in the epigastric area.  Neurological: She is alert and oriented to person, place, and time.  Nursing note and vitals reviewed.   Assessment & Plan  1. Dyslipidemia Now on Zetia, apparently no reported adverse effects. We'll obtain FLP today - Lipid Profile  2. Gastroesophageal reflux disease, esophagitis presence not specified  continue on PPI therapy, referral to gastroenterology for endoscopy. - Ambulatory referral to Gastroenterology - omeprazole (PRILOSEC) 40 MG capsule; Take 1 capsule (40 mg total) by mouth daily.  Dispense: 90 capsule; Refill: 1   Ann Pena Asad A. Faylene KurtzShah [facility] 07/26/2015 11:33 AM

## 2015-07-27 LAB — LIPID PANEL
Chol/HDL Ratio: 9.2 ratio units — ABNORMAL HIGH (ref 0.0–4.4)
Cholesterol, Total: 276 mg/dL — ABNORMAL HIGH (ref 100–199)
HDL: 30 mg/dL — ABNORMAL LOW (ref >39)
LDL Calculated: 189 mg/dL — ABNORMAL HIGH (ref 0–99)
Triglycerides: 284 mg/dL — ABNORMAL HIGH (ref 0–149)
VLDL Cholesterol Cal: 57 mg/dL — ABNORMAL HIGH (ref 5–40)

## 2015-08-25 ENCOUNTER — Encounter: Payer: Self-pay | Admitting: Family Medicine

## 2015-08-25 ENCOUNTER — Ambulatory Visit (INDEPENDENT_AMBULATORY_CARE_PROVIDER_SITE_OTHER): Payer: Medicare Other | Admitting: Family Medicine

## 2015-08-25 VITALS — BP 122/70 | HR 91 | Temp 98.4°F | Resp 16 | Ht 65.0 in | Wt 157.3 lb

## 2015-08-25 DIAGNOSIS — R079 Chest pain, unspecified: Secondary | ICD-10-CM

## 2015-08-25 DIAGNOSIS — E785 Hyperlipidemia, unspecified: Secondary | ICD-10-CM | POA: Diagnosis not present

## 2015-08-25 MED ORDER — PITAVASTATIN CALCIUM 1 MG PO TABS: 1.0000 | ORAL_TABLET | Freq: Every day | ORAL | Status: DC

## 2015-08-25 NOTE — Progress Notes (Signed)
Name: Ann Pena   MRN: [id number]    DOB: March 08, 1961   Date:08/25/2015       Progress Note  Subjective  Chief Complaint  Chief Complaint  Patient presents with  . Hyperlipidemia    discuss medication treatment    Hyperlipidemia This is a chronic problem. The problem is uncontrolled. Recent lipid tests were reviewed and are high. Associated symptoms include chest pain (left side chest pain, intermittent, present for 2-3 months.). Pertinent negatives include no myalgias or shortness of breath. Current antihyperlipidemic treatment includes ezetimibe. The current treatment provides no improvement of lipids.    Past Medical History  Diagnosis Date  . Depression   . Anxiety state, unspecified   . Chronic kidney disease, stage III (moderate)   . Other nonspecific abnormal serum enzyme levels   . Undiagnosed cardiac murmurs   . Obesity, unspecified   . Infective otitis externa, unspecified   . 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 Pain in joint, lower leg   . Pain in limb   . Panic disorder without agoraphobia   . Posttraumatic stress disorder   . Shortness of breath   . Chest pain, unspecified   . Anxiety state, unspecified   . Syncope and collapse   . Asthma   . Other and unspecified hyperlipidemia     Past Surgical History  Procedure Laterality Date  . Total abdominal hysterectomy    . Shoulder surgery      Family History  Problem Relation Age of Onset  . Hypertension Mother   . COPD Mother   . Heart attack Mother   . Stroke Mother   . Heart disease Mother   . Hypertension Father   . Bone cancer Father   . Heart attack Father   . Stroke Father   . Heart disease Father   . Thyroid disease Sister     Social History   Social History  . Marital Status: Divorced    Spouse Name: N/A  . Number of Children: N/A  . Years of Education: N/A   Occupational History  . Not on file.   Social History Main Topics  . Smoking status: Current Every  Day Smoker -- 1.50 packs/day for 30 years    Types: Cigarettes  . Smokeless tobacco: Not on file  . Alcohol Use: No  . Drug Use: No  . Sexual Activity: Not on file   Other Topics Concern  . Not on file   Social History Narrative     Current outpatient prescriptions:  .  albuterol (PROAIR HFA) 108 (90 BASE) MCG/ACT inhaler, Inhale 2 puffs into the lungs every 6 (six) hours as needed., Disp: 6.7 g, Rfl: 1 .  ALPRAZolam (XANAX) 1 MG tablet, , Disp: , Rfl:  .  citalopram (CELEXA) 40 MG tablet, Take 1 tablet by mouth daily., Disp: , Rfl:  .  ezetimibe (ZETIA) 10 MG tablet, Take 1 tablet (10 mg total) by mouth daily., Disp: 90 tablet, Rfl: 1 .  mirtazapine (REMERON) 45 MG tablet, Take 1 tablet by mouth at bedtime., Disp: , Rfl:  .  omeprazole (PRILOSEC) 40 MG capsule, Take 1 capsule (40 mg total) by mouth daily., Disp: 90 capsule, Rfl: 1  Allergies  Allergen Reactions  . Penicillins   . Sulfa Antibiotics    Review of Systems  Respiratory: Negative for cough and shortness of breath.   Cardiovascular: Positive for chest pain (left side chest pain, intermittent, present for 2-3 months.).  Musculoskeletal: Negative  for myalgias.    Objective  Filed Vitals:   08/25/15 1326  BP: 122/70  Pulse: 91  Temp: 98.4 F (36.9 C)  TempSrc: Oral  Resp: 16  Height: 5\' 5"  (1.651 m)  Weight: 157 lb 4.8 oz (71.351 kg)  SpO2: 96%    Physical Exam  Constitutional: She is oriented to person, place, and time and well-developed, well-nourished, and in no distress.  HENT:  Head: Normocephalic and atraumatic.  Cardiovascular: Normal rate and regular rhythm.   Murmur heard.  Systolic murmur is present with a grade of 3/6  Pulmonary/Chest: Effort normal and breath sounds normal.  Abdominal: Soft. Bowel sounds are normal.  Neurological: She is alert and oriented to person, place, and time.  Nursing note and vitals reviewed.     Recent Results (from the past 2160 hour(s))  Lipid Profile      Status: Abnormal   Collection Time: 07/26/15 11:49 AM  Result Value Ref Range   Cholesterol, Total 276 (H) 100 - 199 mg/dL   Triglycerides 454284 (H) 0 - 149 mg/dL   HDL 30 (L) >09>39 mg/dL   VLDL Cholesterol Cal 57 (H) 5 - 40 mg/dL   LDL Calculated 811189 (H) 0 - 99 mg/dL   Chol/HDL Ratio 9.2 (H) 0.0 - 4.4 ratio units    Comment:                                   T. Chol/HDL Ratio                                             Men  Women                               1/2 Avg.Risk  3.4    3.3                                   Avg.Risk  5.0    4.4                                2X Avg.Risk  9.6    7.1                                3X Avg.Risk 23.4   11.0      Assessment & Plan  1. HLD (hyperlipidemia) DC Ezitimibe for inefficacy and start on Pitavastatin 1 mg at bedtime. If patient unable to tolerate Pitavastatin, will refer to a lipid specialist - Pitavastatin Calcium 1 MG TABS; Take 1 tablet (1 mg total) by mouth daily.  Dispense: 30 tablet; Refill: 2  2. Chest pain, unspecified No chest pain at present, however recurrent intermittent left-sided chest tightness. Previously seen by cardiology and reportedly had a nuclear stress test, which was considered low risk. We'll refer back to cardiologist for consideration of repeat testing at this time. - Ambulatory referral to Cardiology    [facility] Asad A. Faylene KurtzShah [facility] 08/25/2015 1:27 PM

## 2015-09-06 ENCOUNTER — Ambulatory Visit (INDEPENDENT_AMBULATORY_CARE_PROVIDER_SITE_OTHER): Payer: Medicare Other | Admitting: Cardiovascular Disease

## 2015-09-06 ENCOUNTER — Encounter: Payer: Self-pay | Admitting: Cardiovascular Disease

## 2015-09-06 VITALS — BP 124/76 | HR 77 | Ht 65.0 in | Wt 156.5 lb

## 2015-09-06 DIAGNOSIS — R011 Cardiac murmur, unspecified: Secondary | ICD-10-CM | POA: Diagnosis not present

## 2015-09-06 DIAGNOSIS — R079 Chest pain, unspecified: Secondary | ICD-10-CM | POA: Diagnosis not present

## 2015-09-06 NOTE — Progress Notes (Signed)
Cardiology Office Note   Date:  09/06/2015   ID:  Ann Pena, DOB 1960-04-08, MRN [id number]  PCP:  Brayton ElSyed Shah, MD  Cardiologist:    Lorine BearsMuhammad Arida, MD   Chief Complaint  Patient presents with  . other    Ref by Dr. Sherryll BurgerShah for chest pain; last seen in 2015. Meds reviewed by the patient verbally. Pt. c/o chest pain and shortness of breath.       History of Present Illness: Ann Pena is a 55 y.o. female who presents for  Evaluation of chest pain. She was seen by me in 2015 for chest pain.. She has no history of diabetes or hypertension. She has known history of hyperlipidemia with intolerance to statins, tobacco use and anxiety.  She has strong family history of premature coronary artery disease.  She has dealt with severe anxiety since her son was murdered in 2011. She was evaluated in 2015 with a nuclear stress test which showed no evidence of ischemia with normal ejection fraction. Echocardiogram showed normal LV systolic function with aortic sclerosis. There was no significant aortic valve stenosis. She reports intermittent and rare episodes of sharp chest pain lasting for a few seconds which can happen at rest or with physical activities. She has no tightness or heavy feeling. She also complains of bilateral leg pain with walking and occasionally at rest. She is smoking more than before at one and a half pack per day.    Past Medical History  Diagnosis Date  . Depression   . Anxiety state, unspecified   . Chronic kidney disease, stage III (moderate)   . Other nonspecific abnormal serum enzyme levels   . Undiagnosed cardiac murmurs   . Obesity, unspecified   . Infective otitis externa, unspecified   . 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 Pain in joint, lower leg   . Pain in limb   . Panic disorder without agoraphobia   . Posttraumatic stress disorder   . Shortness of breath   . Chest pain, unspecified   . Anxiety state, unspecified   .  Syncope and collapse   . Asthma   . Other and unspecified hyperlipidemia     Past Surgical History  Procedure Laterality Date  . Total abdominal hysterectomy    . Shoulder surgery       Current Outpatient Prescriptions  Medication Sig Dispense Refill  . albuterol (PROAIR HFA) 108 (90 BASE) MCG/ACT inhaler Inhale 2 puffs into the lungs every 6 (six) hours as needed. 6.7 g 1  . ALPRAZolam (XANAX) 1 MG tablet Take 1 mg by mouth at bedtime as needed.     . citalopram (CELEXA) 40 MG tablet Take 1 tablet by mouth daily.    . mirtazapine (REMERON) 45 MG tablet Take 1 tablet by mouth at bedtime.    Marland Kitchen. omeprazole (PRILOSEC) 40 MG capsule Take 1 capsule (40 mg total) by mouth daily. 90 capsule 1  . Pitavastatin Calcium 1 MG TABS Take 1 tablet (1 mg total) by mouth daily. 30 tablet 2   No current facility-administered medications for this visit.    Allergies:   Penicillins and Sulfa antibiotics    Social History:  The patient  reports that she has been smoking Cigarettes.  She has a 45 pack-year smoking history. She does not have any smokeless tobacco history on file. She reports that she does not drink alcohol or use illicit drugs.   Family History:  The patient's family history  includes Bone cancer in her father; COPD in her mother; Heart attack in her father and mother; Heart disease in her father and mother; Hypertension in her father and mother; Stroke in her father and mother; Thyroid disease in her sister.    ROS:  Please see the history of present illness.   Otherwise, review of systems are positive for none.   All other systems are reviewed and negative.    PHYSICAL EXAM: VS:  BP 124/76 mmHg  Pulse 77  Ht  (1.651 m)  Wt 156 lb 8 oz (70.988 kg)  BMI 26.04 kg/m2 , BMI Body mass index is 26.04 kg/(m^2). GEN: Well nourished, well developed, in no acute distress HEENT: normal Neck: no JVD, carotid bruits, or masses Cardiac: RRR; no  rubs, or gallops,no edema . There is 2/6  crescendo decrescendo aortic murmur which is early peaking. Respiratory:  clear to auscultation bilaterally, normal work of breathing GI: soft, nontender, nondistended, + BS MS: no deformity or atrophy Skin: warm and dry, no rash Neuro:  Strength and sensation are intact Psych: euthymic mood, full affect   EKG:  EKG is ordered today. The ekg ordered today demonstrates normal sinus rhythm with no significant ST or T wave changes.   Recent Labs: 04/27/2015: ALT 6; BUN 8; Creatinine, Ser 0.99; Potassium 4.3; Sodium 140    Lipid Panel    Component Value Date/Time   CHOL 276* 07/26/2015 1149   CHOL 266* 06/05/2014   TRIG 284* 07/26/2015 1149   HDL 30* 07/26/2015 1149   HDL 35 06/05/2014   CHOLHDL 9.2* 07/26/2015 1149   LDLCALC 189* 07/26/2015 1149   LDLCALC 182 06/05/2014      Wt Readings from Last 3 Encounters:  09/06/15 156 lb 8 oz (70.988 kg)  08/25/15 157 lb 4.8 oz (71.351 kg)  07/26/15 160 lb 14.4 oz (72.984 kg)         ASSESSMENT AND PLAN:  1.  Atypical chest pain: Her chest pain seems to be musculoskeletal and might be worsened by underlying anxiety distress. Previous workup in 2015 was unremarkable. Current EKG is normal. I do not think further ischemic workup is needed at the present time. I discussed with her the importance of controlling her risk factors.  2. Aortic sclerosis: This is the cause of her cardiac murmur. The heart murmur today is not suggestive of significant stenosis. I am going to monitor this patient on a yearly basis and Plan on repeating echocardiogram next year  3. Hyperlipidemia: Intolerance to multiple statins. She was started recently on Pitavastatin.   4. Tobacco use: I had a prolonged discussion with her about the importance of smoking cessation.    Disposition:   FU with me in 1 year  Signed,  Lorine Bears, MD  09/06/2015 12:22 PM    [facility]

## 2015-09-06 NOTE — Patient Instructions (Signed)
Medication Instructions: Continue same medications.   Labwork: None.   Procedures/Testing: None.   Follow-Up: 1 year with Dr. Arida.   Any Additional Special Instructions Will Be Listed Below (If Applicable).     If you need a refill on your cardiac medications before your next appointment, please call your pharmacy.   

## 2015-10-07 ENCOUNTER — Encounter: Payer: Self-pay | Admitting: Family Medicine

## 2015-10-07 ENCOUNTER — Ambulatory Visit (INDEPENDENT_AMBULATORY_CARE_PROVIDER_SITE_OTHER): Payer: Medicare Other | Admitting: Family Medicine

## 2015-10-07 ENCOUNTER — Ambulatory Visit
Admission: RE | Admit: 2015-10-07 | Discharge: 2015-10-07 | Disposition: A | Discharge status: Home / Self Care | Payer: Medicare Other | Source: Ambulatory Visit | Attending: Family Medicine | Admitting: Family Medicine

## 2015-10-07 VITALS — BP 126/68 | HR 88 | Temp 97.6°F | Resp 17 | Ht 65.0 in | Wt 155.1 lb

## 2015-10-07 DIAGNOSIS — M792 Neuralgia and neuritis, unspecified: Secondary | ICD-10-CM | POA: Diagnosis not present

## 2015-10-07 DIAGNOSIS — M79621 Pain in right upper arm: Secondary | ICD-10-CM | POA: Diagnosis not present

## 2015-10-07 DIAGNOSIS — M79631 Pain in right forearm: Secondary | ICD-10-CM | POA: Diagnosis not present

## 2015-10-07 MED ORDER — NAPROXEN 500 MG PO TABS: 500.0000 mg | ORAL_TABLET | Freq: Two times a day (BID) | ORAL | Status: DC

## 2015-10-07 NOTE — Progress Notes (Signed)
Name: Ann Pena   MRN: [id number]    DOB: 1960-12-29   Date:10/07/2015       Progress Note  Subjective  Chief Complaint  Chief Complaint  Patient presents with  . Follow-up    Discuss lab results  . Shoulder Pain    Arm Pain  There was no injury mechanism. The pain is present in the right hand, upper right arm and right forearm (started 4 weesk ago, no inciting event, pain in upper arm traveling down through the elbow joint  into the distal arm, tingling into the right hand). The quality of the pain is described as aching. The pain radiates to the right hand. The pain is at a severity of 10/10. The pain is severe. Associated symptoms include tingling. She has tried NSAIDs and heat for the symptoms. The treatment provided no relief.    Past Medical History  Diagnosis Date  . Depression   . Anxiety state, unspecified   . Chronic kidney disease, stage III (moderate)   . Other nonspecific abnormal serum enzyme levels   . Undiagnosed cardiac murmurs   . Obesity, unspecified   . Infective otitis externa, unspecified   . 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 Pain in joint, lower leg   . Pain in limb   . Panic disorder without agoraphobia   . Posttraumatic stress disorder   . Shortness of breath   . Chest pain, unspecified   . Anxiety state, unspecified   . Syncope and collapse   . Asthma   . Other and unspecified hyperlipidemia     Past Surgical History  Procedure Laterality Date  . Total abdominal hysterectomy    . Shoulder surgery      Family History  Problem Relation Age of Onset  . Hypertension Mother   . COPD Mother   . Heart attack Mother   . Stroke Mother   . Heart disease Mother   . Hypertension Father   . Bone cancer Father   . Heart attack Father   . Stroke Father   . Heart disease Father   . Thyroid disease Sister     Social History   Social History  . Marital Status: Divorced    Spouse Name: N/A  . Number of Children: N/A  .  Years of Education: N/A   Occupational History  . Not on file.   Social History Main Topics  . Smoking status: Current Every Day Smoker -- 1.50 packs/day for 30 years    Types: Cigarettes  . Smokeless tobacco: Not on file  . Alcohol Use: No  . Drug Use: No  . Sexual Activity: Not on file   Other Topics Concern  . Not on file   Social History Narrative     Current outpatient prescriptions:  .  albuterol (PROAIR HFA) 108 (90 BASE) MCG/ACT inhaler, Inhale 2 puffs into the lungs every 6 (six) hours as needed., Disp: 6.7 g, Rfl: 1 .  ALPRAZolam (XANAX) 1 MG tablet, Take 1 mg by mouth at bedtime as needed. , Disp: , Rfl:  .  citalopram (CELEXA) 40 MG tablet, Take 1 tablet by mouth daily., Disp: , Rfl:  .  mirtazapine (REMERON) 45 MG tablet, Take 1 tablet by mouth at bedtime., Disp: , Rfl:  .  omeprazole (PRILOSEC) 40 MG capsule, Take 1 capsule (40 mg total) by mouth daily., Disp: 90 capsule, Rfl: 1 .  Pitavastatin Calcium 1 MG TABS, Take 1 tablet (1 mg total) by  mouth daily., Disp: 30 tablet, Rfl: 2  Allergies  Allergen Reactions  . Penicillins   . Sulfa Antibiotics    Review of Systems  Musculoskeletal: Positive for myalgias. Negative for joint pain.  Neurological: Positive for tingling. Negative for focal weakness.     Objective  Filed Vitals:   10/07/15 1448  BP: 126/68  Pulse: 88  Temp: 97.6 F (36.4 C)  TempSrc: Oral  Resp: 17  Height: 5\' 5"  (1.651 m)  Weight: 155 lb 1.6 oz (70.353 kg)  SpO2: 96%    Physical Exam  Constitutional: She is well-developed, well-nourished, and in no distress.  Musculoskeletal:       Right shoulder: Normal.       Right upper arm: She exhibits tenderness. She exhibits no swelling, no edema and no deformity.       Right forearm: She exhibits tenderness. She exhibits no swelling, no edema and no deformity.       Arms: Tenderness to palpation in the right upper arm along the biceps and triceps muscle. Tenderness to palpation over  the right forearm. Elbow joint is normal.  Nursing note and vitals reviewed.    Assessment & Plan  1. Radicular pain in right arm Likely muscular pain, obtain x-ray of humerus and forearm for evaluation. Started on high-dose NSAID therapy. Recheck in 2 weeks. - DG Forearm Right; Future - DG Humerus Right; Future - naproxen (NAPROSYN) 500 MG tablet; Take 1 tablet (500 mg total) by mouth 2 (two) times daily with a meal.  Dispense: 20 tablet; Refill: 0   Javyon Fontan Asad A. Faylene KurtzShah [facility] 10/07/2015 3:08 PM

## 2015-10-11 ENCOUNTER — Telehealth: Payer: Self-pay | Admitting: Family Medicine

## 2015-10-11 DIAGNOSIS — F322 Major depressive disorder, single episode, severe without psychotic features: Secondary | ICD-10-CM | POA: Diagnosis not present

## 2015-10-11 NOTE — Telephone Encounter (Signed)
Routed to Dr. Shah for pt advice 

## 2015-10-11 NOTE — Telephone Encounter (Signed)
If the pain is not better on high-dose NSAID therapy, she may be referred to orthopedics. Please confirm

## 2015-10-11 NOTE — Telephone Encounter (Signed)
Gave patient x-ray results per Dr. Margaretmary EddyShah's result note and patient is now wanting to know want does she need to do or see due to pain and tingling she is still experiencing.

## 2015-10-14 ENCOUNTER — Telehealth: Payer: Self-pay | Admitting: Family Medicine

## 2015-10-14 DIAGNOSIS — M792 Neuralgia and neuritis, unspecified: Secondary | ICD-10-CM

## 2015-10-14 NOTE — Telephone Encounter (Signed)
PT SAID THAT SHE GOT RESULTS OF HER R ARM AND WAS TOLD NO BREAKS BUT SHE IS STILL HAVING THE TINGLING, BURNING THAT GOES TO FROM HER ELBOW TO HER HAND. THE PAIN KEEPS HER UP ALL NIGHT. SHE IS JUST WANTING TO KNOW WHERE DOES SHE GO FROM HER.  SAYS THAT IT FEELS LIKE IT IS ASLEEP.DOES SHE GET REFERRED TO A ORTHO OR WHAT.

## 2015-10-14 NOTE — Telephone Encounter (Signed)
Referral to orthopedics entered

## 2015-10-18 DIAGNOSIS — M5412 Radiculopathy, cervical region: Secondary | ICD-10-CM | POA: Diagnosis not present

## 2015-10-18 DIAGNOSIS — M542 Cervicalgia: Secondary | ICD-10-CM | POA: Diagnosis not present

## 2015-10-18 DIAGNOSIS — G5601 Carpal tunnel syndrome, right upper limb: Secondary | ICD-10-CM | POA: Diagnosis not present

## 2015-11-19 ENCOUNTER — Other Ambulatory Visit: Payer: Self-pay | Admitting: Family Medicine

## 2015-11-19 DIAGNOSIS — J4520 Mild intermittent asthma, uncomplicated: Secondary | ICD-10-CM

## 2015-11-19 DIAGNOSIS — F4310 Post-traumatic stress disorder, unspecified: Secondary | ICD-10-CM | POA: Diagnosis not present

## 2015-11-25 ENCOUNTER — Telehealth: Payer: Self-pay

## 2015-11-25 MED ORDER — ALBUTEROL SULFATE (2.5 MG/3ML) 0.083% IN NEBU: 2.5000 mg | INHALATION_SOLUTION | Freq: Four times a day (QID) | RESPIRATORY_TRACT | 1 refills | Status: DC | PRN

## 2015-11-25 NOTE — Telephone Encounter (Signed)
Prescription for Ventolin inhaler [facility] per Dr. Sherryll BurgerShah due to insurance has rejected ProAir HFA

## 2015-12-17 ENCOUNTER — Other Ambulatory Visit: Payer: Self-pay | Admitting: Family Medicine

## 2015-12-17 DIAGNOSIS — K219 Gastro-esophageal reflux disease without esophagitis: Secondary | ICD-10-CM

## 2016-01-21 DIAGNOSIS — F4310 Post-traumatic stress disorder, unspecified: Secondary | ICD-10-CM | POA: Diagnosis not present

## 2016-02-18 DIAGNOSIS — F4310 Post-traumatic stress disorder, unspecified: Secondary | ICD-10-CM | POA: Diagnosis not present

## 2016-04-11 DIAGNOSIS — F322 Major depressive disorder, single episode, severe without psychotic features: Secondary | ICD-10-CM | POA: Diagnosis not present

## 2016-04-20 ENCOUNTER — Encounter: Payer: Medicare Other | Admitting: Family Medicine

## 2016-05-18 ENCOUNTER — Encounter: Payer: Medicare Other | Admitting: Family Medicine

## 2016-06-19 ENCOUNTER — Encounter: Payer: Self-pay | Admitting: Family Medicine

## 2016-06-19 ENCOUNTER — Ambulatory Visit (INDEPENDENT_AMBULATORY_CARE_PROVIDER_SITE_OTHER): Payer: Medicare Other | Admitting: Family Medicine

## 2016-06-19 VITALS — BP 123/73 | HR 80 | Temp 97.8°F | Resp 17 | Ht 65.0 in | Wt 162.9 lb

## 2016-06-19 DIAGNOSIS — Z1211 Encounter for screening for malignant neoplasm of colon: Secondary | ICD-10-CM

## 2016-06-19 DIAGNOSIS — Z1231 Encounter for screening mammogram for malignant neoplasm of breast: Secondary | ICD-10-CM

## 2016-06-19 DIAGNOSIS — E162 Hypoglycemia, unspecified: Secondary | ICD-10-CM | POA: Diagnosis not present

## 2016-06-19 DIAGNOSIS — M5412 Radiculopathy, cervical region: Secondary | ICD-10-CM

## 2016-06-19 DIAGNOSIS — E782 Mixed hyperlipidemia: Secondary | ICD-10-CM

## 2016-06-19 DIAGNOSIS — Z0000 Encounter for general adult medical examination without abnormal findings: Secondary | ICD-10-CM | POA: Diagnosis not present

## 2016-06-19 DIAGNOSIS — Z1239 Encounter for other screening for malignant neoplasm of breast: Secondary | ICD-10-CM

## 2016-06-19 LAB — COMPLETE METABOLIC PANEL WITH GFR
ALT: 8 U/L (ref 6–29)
AST: 21 U/L (ref 10–35)
Albumin: 4.3 g/dL (ref 3.6–5.1)
Alkaline Phosphatase: 109 U/L (ref 33–130)
BUN: 6 mg/dL — ABNORMAL LOW (ref 7–25)
CO2: 31 mmol/L (ref 20–31)
Calcium: 9.6 mg/dL (ref 8.6–10.4)
Chloride: 102 mmol/L (ref 98–110)
Creat: 0.92 mg/dL (ref 0.50–1.05)
GFR, Est African American: 81 mL/min (ref >=60)
GFR, Est Non African American: 70 mL/min (ref >=60)
Glucose, Bld: 80 mg/dL (ref 65–99)
Potassium: 4.8 mmol/L (ref 3.5–5.3)
Sodium: 141 mmol/L (ref 135–146)
Total Bilirubin: 0.3 mg/dL (ref 0.2–1.2)
Total Protein: 7.1 g/dL (ref 6.1–8.1)

## 2016-06-19 LAB — LIPID PANEL
Cholesterol: 315 mg/dL — ABNORMAL HIGH (ref <200)
HDL: 35 mg/dL — ABNORMAL LOW (ref >50)
LDL Cholesterol: 222 mg/dL — ABNORMAL HIGH (ref <100)
Total CHOL/HDL Ratio: 9 Ratio — ABNORMAL HIGH (ref <5.0)
Triglycerides: 288 mg/dL — ABNORMAL HIGH (ref <150)
VLDL: 58 mg/dL — ABNORMAL HIGH (ref <30)

## 2016-06-19 LAB — POCT GLYCOSYLATED HEMOGLOBIN (HGB A1C): Hemoglobin A1C: 5.7

## 2016-06-19 MED ORDER — GABAPENTIN 300 MG PO CAPS: 300.0000 mg | ORAL_CAPSULE | Freq: Two times a day (BID) | ORAL | 0 refills | Status: DC

## 2016-06-19 MED ORDER — NAPROXEN 500 MG PO TABS: 500.0000 mg | ORAL_TABLET | Freq: Two times a day (BID) | ORAL | 0 refills | Status: DC

## 2016-06-19 NOTE — Progress Notes (Signed)
Name: Ann Pena   MRN: [id number]    DOB: [birth year]   Date:06/19/2016       Progress Note  Subjective  Chief Complaint  Chief Complaint  Patient presents with  . Annual Exam    CPE w/o pap    HPI  Patient presents for [facility] Annual Wellness Visit.  She is doing well.  Colonoscopy was in 2012/13, was reportedly normal, no records available. Mammogram was done in 2010, was Bi-RADS category 2, benign. She had hysterectomy in 2000, still has right ovary. She does not know if her cervix was removed.  Past Medical History:  Diagnosis Date  . Anxiety state, unspecified   . Anxiety state, unspecified   . Asthma   . 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 Chest pain, unspecified   . Chronic kidney disease, stage III (moderate)   . Depression   . Infective otitis externa, unspecified   . Obesity, unspecified   . Other and unspecified hyperlipidemia   . Other nonspecific abnormal serum enzyme levels   . Pain in joint, lower leg   . Pain in limb   . Panic disorder without agoraphobia   . Posttraumatic stress disorder   . Shortness of breath   . Syncope and collapse   . Undiagnosed cardiac murmurs     Past Surgical History:  Procedure Laterality Date  . SHOULDER SURGERY    . TOTAL ABDOMINAL HYSTERECTOMY      Family History  Problem Relation Age of Onset  . Hypertension Mother   . COPD Mother   . Heart attack Mother   . Stroke Mother   . Heart disease Mother   . Hypertension Father   . Bone cancer Father   . Heart attack Father   . Stroke Father   . Heart disease Father   . Thyroid disease Sister     Social History   Social History  . Marital status: Divorced    Spouse name: N/A  . Number of children: N/A  . Years of education: N/A   Occupational History  . Not on file.   Social History Main Topics  . Smoking status: Current Every Day Smoker    Packs/day: 1.50    Years: 30.00    Types: Cigarettes  . Smokeless tobacco: Never Used   . Alcohol use No  . Drug use: No  . Sexual activity: Not on file   Other Topics Concern  . Not on file   Social History Narrative  . No narrative on file     Subjective:   Ann Pena is a 56 y.o. female who presents for Medicare Annual (Subsequent) preventive examination.  Review of Systems:         Objective:     Vitals: BP 123/73 (BP Location: Right Arm, Patient Position: Sitting, Cuff Size: Normal)   Pulse 80   Temp 97.8 F (36.6 C) (Oral)   Resp 17   Ht 5\' 5"  (1.651 m)   Wt 162 lb 14.4 oz (73.9 kg)   SpO2 96%   BMI 27.11 kg/m   Body mass index is 27.11 kg/m.   Tobacco History  Smoking Status  . Current Every Day Smoker  . Packs/day: 1.50  . Years: 30.00  . Types: Cigarettes  Smokeless Tobacco  . Never Used     Ready to quit: Not Answered Counseling given: Not Answered   Past Medical History:  Diagnosis Date  . Anxiety state, unspecified   . Anxiety  state, unspecified   . Asthma   . 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 Chest pain, unspecified   . Chronic kidney disease, stage III (moderate)   . Depression   . Infective otitis externa, unspecified   . Obesity, unspecified   . Other and unspecified hyperlipidemia   . Other nonspecific abnormal serum enzyme levels   . Pain in joint, lower leg   . Pain in limb   . Panic disorder without agoraphobia   . Posttraumatic stress disorder   . Shortness of breath   . Syncope and collapse   . Undiagnosed cardiac murmurs    Past Surgical History:  Procedure Laterality Date  . SHOULDER SURGERY    . TOTAL ABDOMINAL HYSTERECTOMY     Family History  Problem Relation Age of Onset  . Hypertension Mother   . COPD Mother   . Heart attack Mother   . Stroke Mother   . Heart disease Mother   . Hypertension Father   . Bone cancer Father   . Heart attack Father   . Stroke Father   . Heart disease Father   . Thyroid disease Sister    History  Sexual Activity  . Sexual  activity: Not on file    Outpatient Encounter Prescriptions as of 06/19/2016  Medication Sig  . albuterol (PROVENTIL) (2.5 MG/3ML) 0.083% nebulizer solution Take 3 mLs (2.5 mg total) by nebulization every 6 (six) hours as needed for wheezing or shortness of breath.  . ALPRAZolam (XANAX) 1 MG tablet Take 1 mg by mouth at bedtime as needed.   . citalopram (CELEXA) 40 MG tablet Take 1 tablet by mouth daily.  . mirtazapine (REMERON) 45 MG tablet Take 1 tablet by mouth at bedtime.  Marland Kitchen omeprazole (PRILOSEC) 40 MG capsule TAKE ONE CAPSULE BY MOUTH DAILY  . naproxen (NAPROSYN) 500 MG tablet Take 1 tablet (500 mg total) by mouth 2 (two) times daily with a meal. (Patient not taking: Reported on 06/19/2016)  . Pitavastatin Calcium 1 MG TABS Take 1 tablet (1 mg total) by mouth daily. (Patient not taking: Reported on 06/19/2016)   No facility-administered encounter medications on file as of 06/19/2016.     Activities of Daily Living In your present state of health, do you have any difficulty performing the following activities: 06/19/2016 10/07/2015  Hearing? Malvin Johns  Vision? Y N  Difficulty concentrating or making decisions? N N  Walking or climbing stairs? N N  Dressing or bathing? N N  Doing errands, shopping? N N  Some recent data might be hidden    Patient Care Team: Ellyn Hack, MD as PCP - General (Family Medicine)    Assessment:     Exercise Activities and Dietary recommendations    Goals    None     Fall Risk Fall Risk  06/19/2016 10/07/2015 08/25/2015 07/26/2015 05/26/2015  Falls in the past year? No No No No No   Depression Screen PHQ 2/9 Scores 06/19/2016 10/07/2015 08/25/2015 07/26/2015  PHQ - 2 Score 0 0 0 0     Cognitive Function        Immunization History  Administered Date(s) Administered  . Influenza-Unspecified 01/20/2014   Screening Tests Health Maintenance  Topic Date Due  . Hepatitis C Screening  [birth year]  . HIV Screening  08/20/1975  . TETANUS/TDAP   08/20/1979  . PAP SMEAR  08/19/1981  . MAMMOGRAM  08/20/2010  . INFLUENZA VACCINE  11/02/2015  . COLONOSCOPY  05/04/2021  Plan:   During the course of the visit the patient was educated and counseled about the following appropriate screening and preventive services:   Vaccines to include Pneumoccal, Influenza, Hepatitis B, Td, Zostavax, HCV  Electrocardiogram  Cardiovascular Disease  Colorectal cancer screening  Bone density screening  Diabetes screening  Glaucoma screening  Mammography/PAP    Patient Instructions (the written plan) was given to the patient.   Brayton El, MD  06/19/2016       Current Outpatient Prescriptions:  .  albuterol (PROVENTIL) (2.5 MG/3ML) 0.083% nebulizer solution, Take 3 mLs (2.5 mg total) by nebulization every 6 (six) hours as needed for wheezing or shortness of breath., Disp: 150 mL, Rfl: 1 .  ALPRAZolam (XANAX) 1 MG tablet, Take 1 mg by mouth at bedtime as needed. , Disp: , Rfl:  .  citalopram (CELEXA) 40 MG tablet, Take 1 tablet by mouth daily., Disp: , Rfl:  .  mirtazapine (REMERON) 45 MG tablet, Take 1 tablet by mouth at bedtime., Disp: , Rfl:  .  omeprazole (PRILOSEC) 40 MG capsule, TAKE ONE CAPSULE BY MOUTH DAILY, Disp: 90 capsule, Rfl: 1 .  naproxen (NAPROSYN) 500 MG tablet, Take 1 tablet (500 mg total) by mouth 2 (two) times daily with a meal. (Patient not taking: Reported on 06/19/2016), Disp: 20 tablet, Rfl: 0 .  Pitavastatin Calcium 1 MG TABS, Take 1 tablet (1 mg total) by mouth daily. (Patient not taking: Reported on 06/19/2016), Disp: 30 tablet, Rfl: 2  Allergies  Allergen Reactions  . Penicillins   . Sulfa Antibiotics      Review of Systems  Constitutional: Positive for malaise/fatigue. Negative for chills and fever.  HENT: Positive for hearing loss and tinnitus. Negative for congestion, ear pain and sore throat.   Eyes: Negative for blurred vision and double vision.  Respiratory: Negative for cough, sputum  production and shortness of breath (sometimes gets short of breath).   Cardiovascular: Positive for palpitations. Negative for chest pain and leg swelling.  Gastrointestinal: Positive for heartburn (barrett's esophagus). Negative for abdominal pain, blood in stool, constipation, diarrhea, nausea and vomiting.  Genitourinary: Negative for dysuria and hematuria.  Musculoskeletal: Positive for neck pain. Negative for back pain and myalgias.  Skin: Negative for itching and rash.  Neurological: Negative for dizziness and headaches.  Psychiatric/Behavioral: Positive for depression. Negative for suicidal ideas. The patient is nervous/anxious and has insomnia.     Objective  Vitals:   06/19/16 1348  BP: 123/73  Pulse: 80  Resp: 17  Temp: 97.8 F (36.6 C)  TempSrc: Oral  SpO2: 96%  Weight: 162 lb 14.4 oz (73.9 kg)  Height: 5\' 5"  (1.651 m)    Physical Exam  Constitutional: She is oriented to person, place, and time and well-developed, well-nourished, and in no distress.  HENT:  Head: Normocephalic and atraumatic.  Eyes: Pupils are equal, round, and reactive to light.  Neck: Neck supple.  Cardiovascular: Normal rate, regular rhythm and normal heart sounds.   No murmur heard. Pulmonary/Chest: Effort normal and breath sounds normal. She has no wheezes.  Abdominal: Soft. Bowel sounds are normal. There is no tenderness.  Genitourinary:  Genitourinary Comments: deferred  Musculoskeletal: She exhibits no edema.  Neurological: She is alert and oriented to person, place, and time.  Skin: Skin is warm and dry.  Psychiatric: Mood, memory, affect and judgment normal.  Nursing note and vitals reviewed.     Assessment & Plan  1. Medicare annual wellness visit, subsequent Completed and reviewed the nurse health  advisors recommendations  2. Screening for breast cancer  - MM DIGITAL SCREENING BILATERAL; Future  3. Screening for colon cancer  - Cologuard  4. Cervical radicular pain He  has been seen by spine specialist who has recommended physical therapy before an MRI can be ordered. Naproxen and gabapentin provides adequate symptom relief and was started on treatment - naproxen (NAPROSYN) 500 MG tablet; Take 1 tablet (500 mg total) by mouth 2 (two) times daily with a meal.  Dispense: 30 tablet; Refill: 0 - gabapentin (NEURONTIN) 300 MG capsule; Take 1 capsule (300 mg total) by mouth 2 (two) times daily.  Dispense: 60 capsule; Refill: 0  5. Hypoglycemia Point-of-care A1c is 5.7%, considered to be borderline prediabetes - POCT glycosylated hemoglobin (Hb A1C)  6. Mixed hyperlipidemia  - Lipid panel - COMPLETE METABOLIC PANEL WITH GFR   Kaleesi Guyton Asad A. Faylene KurtzShah [facility] 06/19/2016 2:44 PM

## 2016-06-23 ENCOUNTER — Telehealth: Payer: Self-pay | Admitting: Family Medicine

## 2016-06-23 NOTE — Telephone Encounter (Signed)
Pt would like lab results.  

## 2016-08-29 ENCOUNTER — Other Ambulatory Visit: Payer: Self-pay | Admitting: Family Medicine

## 2016-08-29 DIAGNOSIS — K219 Gastro-esophageal reflux disease without esophagitis: Secondary | ICD-10-CM

## 2016-08-29 DIAGNOSIS — M5412 Radiculopathy, cervical region: Secondary | ICD-10-CM

## 2016-08-31 ENCOUNTER — Ambulatory Visit (INDEPENDENT_AMBULATORY_CARE_PROVIDER_SITE_OTHER): Payer: Medicare Other | Admitting: Family Medicine

## 2016-08-31 ENCOUNTER — Encounter: Payer: Self-pay | Admitting: Family Medicine

## 2016-08-31 VITALS — BP 120/69 | HR 77 | Temp 98.1°F | Resp 16 | Ht 65.0 in | Wt 153.6 lb

## 2016-08-31 DIAGNOSIS — K219 Gastro-esophageal reflux disease without esophagitis: Secondary | ICD-10-CM | POA: Diagnosis not present

## 2016-08-31 DIAGNOSIS — E782 Mixed hyperlipidemia: Secondary | ICD-10-CM | POA: Diagnosis not present

## 2016-08-31 DIAGNOSIS — Z1159 Encounter for screening for other viral diseases: Secondary | ICD-10-CM | POA: Diagnosis not present

## 2016-08-31 DIAGNOSIS — Z720 Tobacco use: Secondary | ICD-10-CM

## 2016-08-31 MED ORDER — ROSUVASTATIN CALCIUM 10 MG PO TABS: 10.0000 mg | ORAL_TABLET | Freq: Every day | ORAL | 0 refills | Status: DC

## 2016-08-31 MED ORDER — OMEPRAZOLE 40 MG PO CPDR: 40.0000 mg | DELAYED_RELEASE_CAPSULE | Freq: Every day | ORAL | 1 refills | Status: DC

## 2016-08-31 NOTE — Progress Notes (Signed)
Name: RENELLE STEGENGA   MRN: [id number]    DOB: 10-02-1960   Date:08/31/2016       Progress Note  Subjective  Chief Complaint  Chief Complaint  Patient presents with  . Annual Exam    CPE    Hyperlipidemia  This is a chronic problem. The problem is uncontrolled. Recent lipid tests were reviewed and are high. She is currently on no antihyperlipidemic treatment (not taking Livalo becasue of myalgias). Risk factors for coronary artery disease include diabetes mellitus.  Gastroesophageal Reflux  She complains of heartburn. She reports no abdominal pain, no belching or no coughing. This is a chronic problem. The problem occurs constantly. The symptoms are aggravated by certain foods (sodas and smoking makes it worse.). Associated symptoms include fatigue. She has tried a PPI for the symptoms.  Nicotine Dependence  Presents for initial visit. Symptoms include cravings and fatigue. Preferred tobacco types include cigarettes. Preferred strength is regular. Preferred cigarettes are menthol. Cigarette brand: L&M. Her urge triggers include company of smokers and stress. Her first smoke is before 6 AM. She smokes 1 pack (15+ cigarettes a day) of cigarettes per day. She started smoking when she was 7-9 years old. Past treatments include nothing. Laiza is not interested in quitting. Fayette has tried to quit 1 time.     Past Medical History:  Diagnosis Date  . Anxiety state, unspecified   . Anxiety state, unspecified   . Asthma   . 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 Chest pain, unspecified   . Chronic kidney disease, stage III (moderate)   . Depression   . Infective otitis externa, unspecified   . Obesity, unspecified   . Other and unspecified hyperlipidemia   . Other nonspecific abnormal serum enzyme levels   . Pain in joint, lower leg   . Pain in limb   . Panic disorder without agoraphobia   . Posttraumatic stress disorder   . Shortness of breath   . Syncope and  collapse   . Undiagnosed cardiac murmurs     Past Surgical History:  Procedure Laterality Date  . SHOULDER SURGERY    . TOTAL ABDOMINAL HYSTERECTOMY      Family History  Problem Relation Age of Onset  . Hypertension Mother   . COPD Mother   . Heart attack Mother   . Stroke Mother   . Heart disease Mother   . Hypertension Father   . Bone cancer Father   . Heart attack Father   . Stroke Father   . Heart disease Father   . Thyroid disease Sister     Social History   Social History  . Marital status: Divorced    Spouse name: N/A  . Number of children: N/A  . Years of education: N/A   Occupational History  . Not on file.   Social History Main Topics  . Smoking status: Current Every Day Smoker    Packs/day: 1.50    Years: 30.00    Types: Cigarettes  . Smokeless tobacco: Never Used  . Alcohol use No  . Drug use: No  . Sexual activity: Not on file   Other Topics Concern  . Not on file   Social History Narrative  . No narrative on file     Current Outpatient Prescriptions:  .  albuterol (PROVENTIL) (2.5 MG/3ML) 0.083% nebulizer solution, Take 3 mLs (2.5 mg total) by nebulization every 6 (six) hours as needed for wheezing or shortness of breath., Disp: 150 mL,  Rfl: 1 .  ALPRAZolam (XANAX) 1 MG tablet, Take 1 mg by mouth at bedtime as needed. , Disp: , Rfl:  .  citalopram (CELEXA) 40 MG tablet, Take 1 tablet by mouth daily., Disp: , Rfl:  .  gabapentin (NEURONTIN) 300 MG capsule, Take 1 capsule by mouth twice a day., Disp: 180 capsule, Rfl: 0 .  mirtazapine (REMERON) 45 MG tablet, Take 1 tablet by mouth at bedtime., Disp: , Rfl:  .  naproxen (NAPROSYN) 500 MG tablet, Take 1 tablet (500 mg total) by mouth 2 (two) times daily with a meal., Disp: 30 tablet, Rfl: 0 .  omeprazole (PRILOSEC) 40 MG capsule, TAKE ONE CAPSULE BY MOUTH DAILY, Disp: 90 capsule, Rfl: 1 .  Pitavastatin Calcium 1 MG TABS, Take 1 tablet (1 mg total) by mouth daily. (Patient not taking: Reported on  06/19/2016), Disp: 30 tablet, Rfl: 2  Allergies  Allergen Reactions  . Penicillins   . Sulfa Antibiotics      Review of Systems  Constitutional: Positive for fatigue.  Respiratory: Negative for cough.   Gastrointestinal: Positive for heartburn. Negative for abdominal pain.    Objective  Vitals:   08/31/16 1051  BP: 120/69  Pulse: 77  Resp: 16  Temp: 98.1 F (36.7 C)  TempSrc: Oral  SpO2: 96%  Weight: 153 lb 9.6 oz (69.7 kg)  Height: 5\' 5"  (1.651 m)    Physical Exam  Constitutional: She is well-developed, well-nourished, and in no distress.  Cardiovascular: Normal rate, regular rhythm, S1 normal and S2 normal.   Murmur heard.  Crescendo systolic murmur is present with a grade of 2/6  Pulmonary/Chest: Effort normal and breath sounds normal. She has no wheezes.  Abdominal: Soft. Bowel sounds are normal. There is tenderness in the epigastric area.  Musculoskeletal:       Left ankle: She exhibits no swelling.  Psychiatric: Memory and judgment normal. She exhibits a depressed mood. She has a flat affect.  Nursing note and vitals reviewed.      Assessment & Plan  1. Gastroesophageal reflux disease, esophagitis presence not specified Stable and responsive to PPI - omeprazole (PRILOSEC) 40 MG capsule; Take 1 capsule (40 mg total) by mouth daily.  Dispense: 90 capsule; Refill: 1  2. Mixed hyperlipidemia Intolerant to different statins, Livalo was started last year and patient has not been able to tolerated as well. We'll restart on low-dose rosuvastatin and increase as tolerated. Patient is unwilling to take the injectable cholesterol lowering medications at this time - rosuvastatin (CRESTOR) 10 MG tablet; Take 1 tablet (10 mg total) by mouth daily.  Dispense: 90 tablet; Refill: 0  3. Tobacco abuse Patient is not ready to quit smoking at this stage.  4. Need for hepatitis C screening test  - Hepatitis C antibody   Jonnatan Hanners Asad A. Faylene KurtzShah [facility] 08/31/2016 11:14 AM

## 2016-09-01 LAB — HEPATITIS C ANTIBODY: HCV Ab: NEGATIVE

## 2016-10-02 ENCOUNTER — Other Ambulatory Visit: Payer: Self-pay | Admitting: Family Medicine

## 2016-10-02 DIAGNOSIS — E782 Mixed hyperlipidemia: Secondary | ICD-10-CM

## 2016-12-06 ENCOUNTER — Ambulatory Visit (INDEPENDENT_AMBULATORY_CARE_PROVIDER_SITE_OTHER): Payer: Medicare Other | Admitting: Family Medicine

## 2016-12-06 ENCOUNTER — Encounter: Payer: Self-pay | Admitting: Family Medicine

## 2016-12-06 VITALS — BP 120/71 | HR 83 | Temp 98.3°F | Resp 17 | Ht 65.0 in | Wt 158.5 lb

## 2016-12-06 DIAGNOSIS — M542 Cervicalgia: Secondary | ICD-10-CM | POA: Diagnosis not present

## 2016-12-06 DIAGNOSIS — E782 Mixed hyperlipidemia: Secondary | ICD-10-CM | POA: Diagnosis not present

## 2016-12-06 DIAGNOSIS — K219 Gastro-esophageal reflux disease without esophagitis: Secondary | ICD-10-CM

## 2016-12-06 DIAGNOSIS — G8929 Other chronic pain: Secondary | ICD-10-CM

## 2016-12-06 LAB — COMPLETE METABOLIC PANEL WITH GFR
AG Ratio: 1.6 (calc) (ref 1.0–2.5)
ALT: 5 U/L — ABNORMAL LOW (ref 6–29)
AST: 14 U/L (ref 10–35)
Albumin: 4.5 g/dL (ref 3.6–5.1)
Alkaline phosphatase (APISO): 104 U/L (ref 33–130)
BUN/Creatinine Ratio: 8 (calc) (ref 6–22)
BUN: 6 mg/dL — ABNORMAL LOW (ref 7–25)
CO2: 30 mmol/L (ref 20–32)
Calcium: 9.7 mg/dL (ref 8.6–10.4)
Chloride: 99 mmol/L (ref 98–110)
Creat: 0.79 mg/dL (ref 0.50–1.05)
GFR, Est African American: 97 mL/min/{1.73_m2} (ref >=60)
GFR, Est Non African American: 84 mL/min/{1.73_m2} (ref >=60)
Globulin: 2.8 g/dL (calc) (ref 1.9–3.7)
Glucose, Bld: 70 mg/dL (ref 65–99)
Potassium: 4.6 mmol/L (ref 3.5–5.3)
Sodium: 139 mmol/L (ref 135–146)
Total Bilirubin: 0.3 mg/dL (ref 0.2–1.2)
Total Protein: 7.3 g/dL (ref 6.1–8.1)

## 2016-12-06 LAB — LIPID PANEL
Cholesterol: 330 mg/dL — ABNORMAL HIGH (ref <200)
HDL: 36 mg/dL — ABNORMAL LOW (ref >50)
LDL Cholesterol (Calc): 228 mg/dL (calc) — ABNORMAL HIGH
Non-HDL Cholesterol (Calc): 294 mg/dL (calc) — ABNORMAL HIGH (ref <130)
Total CHOL/HDL Ratio: 9.2 (calc) — ABNORMAL HIGH (ref <5.0)
Triglycerides: 388 mg/dL — ABNORMAL HIGH (ref <150)

## 2016-12-06 MED ORDER — OMEPRAZOLE 40 MG PO CPDR: 40.0000 mg | DELAYED_RELEASE_CAPSULE | Freq: Every day | ORAL | 1 refills | Status: DC

## 2016-12-06 NOTE — Progress Notes (Signed)
Name: Ann Pena   MRN: [id number]    DOB: [birth year]   Date:12/06/2016       Progress Note  Subjective  Chief Complaint  Chief Complaint  Patient presents with  . Follow-up  . Hyperlipidemia  . Gastroesophageal Reflux  . Shortness of Breath    Hyperlipidemia  This is a chronic problem. The problem is uncontrolled. Recent lipid tests were reviewed and are high. Pertinent negatives include no chest pain or myalgias. Current antihyperlipidemic treatment includes statins. Risk factors for coronary artery disease include dyslipidemia.  Gastroesophageal Reflux  She complains of belching and heartburn. She reports no abdominal pain, no chest pain or no nausea. This is a chronic problem. The symptoms are aggravated by certain foods (spicy foods, not eating for a while etc makes it worse). She has tried a PPI for the symptoms. The treatment provided significant relief. Past procedures include an EGD.     Past Medical History:  Diagnosis Date  . Anxiety state, unspecified   . Anxiety state, unspecified   . Asthma   . 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 Chest pain, unspecified   . Chronic kidney disease, stage III (moderate)   . Depression   . Infective otitis externa, unspecified   . Obesity, unspecified   . Other and unspecified hyperlipidemia   . Other nonspecific abnormal serum enzyme levels   . Pain in joint, lower leg   . Pain in limb   . Panic disorder without agoraphobia   . Posttraumatic stress disorder   . Shortness of breath   . Syncope and collapse   . Undiagnosed cardiac murmurs     Past Surgical History:  Procedure Laterality Date  . SHOULDER SURGERY    . TOTAL ABDOMINAL HYSTERECTOMY      Family History  Problem Relation Age of Onset  . Hypertension Mother   . COPD Mother   . Heart attack Mother   . Stroke Mother   . Heart disease Mother   . Hypertension Father   . Bone cancer Father   . Heart attack Father   . Stroke Father   .  Heart disease Father   . Thyroid disease Sister     Social History   Social History  . Marital status: Divorced    Spouse name: N/A  . Number of children: N/A  . Years of education: N/A   Occupational History  . Not on file.   Social History Main Topics  . Smoking status: Current Every Day Smoker    Packs/day: 1.50    Years: 30.00    Types: Cigarettes  . Smokeless tobacco: Never Used  . Alcohol use No  . Drug use: No  . Sexual activity: Not on file   Other Topics Concern  . Not on file   Social History Narrative  . No narrative on file     Current Outpatient Prescriptions:  .  albuterol (PROVENTIL) (2.5 MG/3ML) 0.083% nebulizer solution, Take 3 mLs (2.5 mg total) by nebulization every 6 (six) hours as needed for wheezing or shortness of breath., Disp: 150 mL, Rfl: 1 .  ALPRAZolam (XANAX) 1 MG tablet, Take 1 mg by mouth at bedtime as needed. , Disp: , Rfl:  .  citalopram (CELEXA) 40 MG tablet, Take 1 tablet by mouth daily., Disp: , Rfl:  .  gabapentin (NEURONTIN) 300 MG capsule, Take 1 capsule by mouth twice a day., Disp: 180 capsule, Rfl: 0 .  mirtazapine (REMERON) 45 MG tablet,  Take 1 tablet by mouth at bedtime., Disp: , Rfl:  .  naproxen (NAPROSYN) 500 MG tablet, Take 1 tablet (500 mg total) by mouth 2 (two) times daily with a meal., Disp: 30 tablet, Rfl: 0 .  omeprazole (PRILOSEC) 40 MG capsule, Take 1 capsule (40 mg total) by mouth daily., Disp: 90 capsule, Rfl: 1 .  rosuvastatin (CRESTOR) 20 MG tablet, Take 1 tablet (20 mg total) by mouth daily., Disp: 90 tablet, Rfl: 0  Allergies  Allergen Reactions  . Penicillins   . Sulfa Antibiotics      Review of Systems  Cardiovascular: Negative for chest pain.  Gastrointestinal: Positive for heartburn. Negative for abdominal pain and nausea.  Musculoskeletal: Negative for myalgias.     Objective  Vitals:   12/06/16 1013  BP: 120/71  Pulse: 83  Resp: 17  Temp: 98.3 F (36.8 C)  TempSrc: Oral  SpO2: 96%    Weight: 158 lb 8 oz (71.9 kg)  Height: 5\' 5"  (1.651 m)    Physical Exam  Constitutional: She is oriented to person, place, and time and well-developed, well-nourished, and in no distress.  HENT:  Head: Normocephalic and atraumatic.  Cardiovascular: Normal rate, regular rhythm and normal heart sounds.   No murmur heard. Pulmonary/Chest: Effort normal and breath sounds normal. She has no wheezes.  Abdominal: Soft. Bowel sounds are normal. There is no tenderness.  Musculoskeletal:       Cervical back: She exhibits tenderness, pain and spasm.       Back:  Neurological: She is alert and oriented to person, place, and time.  Psychiatric: Mood, memory, affect and judgment normal.  Nursing note and vitals reviewed.      Assessment & Plan  1. Gastroesophageal reflux disease, esophagitis presence not specified Chronic and responsive to PPI, continue, consider tapering down to low-dose PPI at next visit - omeprazole (PRILOSEC) 40 MG capsule; Take 1 capsule (40 mg total) by mouth daily.  Dispense: 90 capsule; Refill: 1  2. Mixed hyperlipidemia Obtain FLP, continue on statin - Lipid panel - COMPLETE METABOLIC PANEL WITH GFR  3. Chronic midline posterior neck pain Patient has been seeing a spine specialist, who has recommended an MRI of her cervical spine, she is continuing to take naproxen as needed for pain relief. Records and notes are not available at this time   [facility] Asad A. Faylene KurtzShah [facility] 12/06/2016 10:32 AM

## 2016-12-12 ENCOUNTER — Telehealth: Payer: Self-pay

## 2016-12-12 MED ORDER — ROSUVASTATIN CALCIUM 40 MG PO TABS: 40.0000 mg | ORAL_TABLET | Freq: Every day | ORAL | 0 refills | Status: DC

## 2016-12-12 NOTE — Telephone Encounter (Signed)
Patient has been notified of lab results and a prescription for rosuvastatin 40 mg at bedtime #90 no refills has been sent to [facility] per Dr. Sherryll BurgerShah, patient has been notified

## 2017-01-08 ENCOUNTER — Ambulatory Visit: Payer: Medicare Other

## 2017-01-22 DIAGNOSIS — F4310 Post-traumatic stress disorder, unspecified: Secondary | ICD-10-CM | POA: Diagnosis not present

## 2017-02-02 DIAGNOSIS — F4310 Post-traumatic stress disorder, unspecified: Secondary | ICD-10-CM | POA: Diagnosis not present

## 2017-03-09 DIAGNOSIS — F4310 Post-traumatic stress disorder, unspecified: Secondary | ICD-10-CM | POA: Diagnosis not present

## 2017-03-13 ENCOUNTER — Other Ambulatory Visit: Payer: Self-pay | Admitting: Family Medicine

## 2017-03-13 DIAGNOSIS — M5412 Radiculopathy, cervical region: Secondary | ICD-10-CM

## 2017-03-13 MED ORDER — GABAPENTIN 300 MG PO CAPS: 300.0000 mg | ORAL_CAPSULE | Freq: Two times a day (BID) | ORAL | 0 refills | Status: DC

## 2017-03-13 NOTE — Telephone Encounter (Signed)
Copied from CRM [phone]#[id number]. Topic: Quick Communication - See Telephone Encounter >> Mar 13, 2017 12:22 PM Eston Mouldavis, Cassandre Oleksy B wrote: CRM for notification. See Telephone encounter for:  Refill gabapentin  [facility] 03/13/17.

## 2017-05-15 DIAGNOSIS — F322 Major depressive disorder, single episode, severe without psychotic features: Secondary | ICD-10-CM | POA: Diagnosis not present

## 2017-05-31 ENCOUNTER — Other Ambulatory Visit: Payer: Self-pay | Admitting: Family Medicine

## 2017-05-31 DIAGNOSIS — K219 Gastro-esophageal reflux disease without esophagitis: Secondary | ICD-10-CM

## 2017-06-01 DIAGNOSIS — F322 Major depressive disorder, single episode, severe without psychotic features: Secondary | ICD-10-CM | POA: Diagnosis not present

## 2017-06-01 NOTE — Telephone Encounter (Signed)
30 days only approved; will talk to her at appt about switching to H2 blocker or refer to GI

## 2017-06-15 ENCOUNTER — Encounter: Payer: Self-pay | Admitting: Family Medicine

## 2017-06-15 ENCOUNTER — Ambulatory Visit (INDEPENDENT_AMBULATORY_CARE_PROVIDER_SITE_OTHER): Payer: Medicare Other | Admitting: Family Medicine

## 2017-06-15 VITALS — BP 126/74 | HR 74 | Temp 97.9°F | Ht 65.0 in | Wt 165.1 lb

## 2017-06-15 DIAGNOSIS — E785 Hyperlipidemia, unspecified: Secondary | ICD-10-CM

## 2017-06-15 DIAGNOSIS — Z7189 Other specified counseling: Secondary | ICD-10-CM | POA: Insufficient documentation

## 2017-06-15 DIAGNOSIS — F419 Anxiety disorder, unspecified: Secondary | ICD-10-CM

## 2017-06-15 DIAGNOSIS — N183 Chronic kidney disease, stage 3 unspecified: Secondary | ICD-10-CM | POA: Insufficient documentation

## 2017-06-15 DIAGNOSIS — R0602 Shortness of breath: Secondary | ICD-10-CM | POA: Insufficient documentation

## 2017-06-15 DIAGNOSIS — Z1331 Encounter for screening for depression: Secondary | ICD-10-CM | POA: Insufficient documentation

## 2017-06-15 DIAGNOSIS — Z720 Tobacco use: Secondary | ICD-10-CM

## 2017-06-15 DIAGNOSIS — E162 Hypoglycemia, unspecified: Secondary | ICD-10-CM | POA: Diagnosis not present

## 2017-06-15 DIAGNOSIS — F410 Panic disorder [episodic paroxysmal anxiety] without agoraphobia: Secondary | ICD-10-CM

## 2017-06-15 DIAGNOSIS — E669 Obesity, unspecified: Secondary | ICD-10-CM | POA: Insufficient documentation

## 2017-06-15 DIAGNOSIS — H60509 Unspecified acute noninfective otitis externa, unspecified ear: Secondary | ICD-10-CM | POA: Insufficient documentation

## 2017-06-15 DIAGNOSIS — M25569 Pain in unspecified knee: Secondary | ICD-10-CM | POA: Insufficient documentation

## 2017-06-15 DIAGNOSIS — N182 Chronic kidney disease, stage 2 (mild): Secondary | ICD-10-CM | POA: Diagnosis not present

## 2017-06-15 DIAGNOSIS — Z1231 Encounter for screening mammogram for malignant neoplasm of breast: Secondary | ICD-10-CM | POA: Diagnosis not present

## 2017-06-15 DIAGNOSIS — Z5181 Encounter for therapeutic drug level monitoring: Secondary | ICD-10-CM

## 2017-06-15 NOTE — Assessment & Plan Note (Signed)
Encouraged cessation; I am here to help if/when she wants to stop

## 2017-06-15 NOTE — Assessment & Plan Note (Signed)
Managed by psychiatrist

## 2017-06-15 NOTE — Patient Instructions (Signed)
Return soon for your Medicare wellness If you have not heard anything from my staff in a week about any orders/referrals/studies from today, please contact us here to follow-up [phone]

## 2017-06-15 NOTE — Assessment & Plan Note (Signed)
Continue healthy diet, check lipids today; sounds like it may be familial

## 2017-06-15 NOTE — Assessment & Plan Note (Signed)
Managed well by psychiatrist

## 2017-06-15 NOTE — Assessment & Plan Note (Signed)
Avoid NSAIDs and stay hydrated; check kidneys

## 2017-06-15 NOTE — Progress Notes (Signed)
BP 126/74 (BP Location: Right Arm, Patient Position: Sitting, Cuff Size: Large)   Pulse 74   Temp 97.9 F (36.6 C) (Oral)   Ht 5\' 5"  (1.651 m)   Wt 165 lb 1.6 oz (74.9 kg)   SpO2 96%   BMI 27.47 kg/m    Subjective:    Patient ID: Ann Pena, female    DOB: 05/07/1960, 57 y.o.   MRN: [id number]  HPI: Ann Pena is a 57 y.o. female  Chief Complaint  Patient presents with  . Follow-up    Wants to discuss hypoclycemic test     HPI Patient is new to me; previous provider left our practice  Patient has high cholesterol; over 300 previously; he wanted to put her on a statin, and patient was supposed to come back Nov 6th for hypoglycemia test for sugar drops and appt was canceled No medicines since June; tries to avoid bacon and sausage; thinks it is genetic b/c high cholesterol runs in the family; he tried her on statins x 2 different times, made her legs hurt, felt like she had the flu; did not like the side effects; dealing with suicides and murders in her family, in therapy since her son's murder, went back into therapy when her brother committed suicide in 2009; son murdered in 2011; step-father committed suicide 2015; close friend died in 2018; Dr. Sherryll BurgerShah told her she was at high risk for suicide, and it "pissed her off" She [facility], seeing him and going to therapy, on medicines and they are regulated and work well for her, keeps her calm and no panic attacks for the last 6 weeks She needs a mammogram; ordered by staff; her cervix is gone, s/p hysterectomy; one ovary may remain CKD stage 3, but I cannot find any recent GFR in that range; she has not heard that dx; good water drinker; not many NSAIDs Tobacco use; smoking <= 1 ppd; depends on stress level  [facility] 12/06/2016 08/31/2016 06/19/2016 10/07/2015 08/25/2015  Decreased Interest 0 0 0 0 0  Down, Depressed, Hopeless 1 0 0 0 0  PHQ - 2 Score 1 0 0 0 0    Relevant past medical,  surgical, family and social history reviewed Past Medical History:  Diagnosis Date  . Anxiety state, unspecified   . Anxiety state, unspecified   . Asthma   . 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 Chest pain, unspecified   . Chronic kidney disease, stage III (moderate) (HCC)   . Depression   . Infective otitis externa, unspecified   . Obesity, unspecified   . Other and unspecified hyperlipidemia   . Other nonspecific abnormal serum enzyme levels   . Pain in joint, lower leg   . Pain in limb   . Panic disorder without agoraphobia   . Posttraumatic stress disorder   . Shortness of breath   . Syncope and collapse   . Undiagnosed cardiac murmurs    Past Surgical History:  Procedure Laterality Date  . SHOULDER SURGERY    . TOTAL ABDOMINAL HYSTERECTOMY     Family History  Problem Relation Age of Onset  . Hypertension Mother   . COPD Mother   . Heart attack Mother   . Stroke Mother   . Heart disease Mother   . Hypertension Father   . Bone cancer Father   . Heart attack Father   . Stroke Father   . Heart disease Father   .  Thyroid disease Sister    Social History   Tobacco Use  . Smoking status: Current Every Day Smoker    Packs/day: 1.50    Years: 30.00    Pack years: 45.00    Types: Cigarettes  . Smokeless tobacco: Never Used  Substance Use Topics  . Alcohol use: No  . Drug use: No    Interim medical history since last visit reviewed. Allergies and medications reviewed  Review of Systems Per HPI unless specifically indicated above     Objective:    BP 126/74 (BP Location: Right Arm, Patient Position: Sitting, Cuff Size: Large)   Pulse 74   Temp 97.9 F (36.6 C) (Oral)   Ht 5\' 5"  (1.651 m)   Wt 165 lb 1.6 oz (74.9 kg)   SpO2 96%   BMI 27.47 kg/m   Wt Readings from Last 3 Encounters:  06/15/17 165 lb 1.6 oz (74.9 kg)  12/06/16 158 lb 8 oz (71.9 kg)  08/31/16 153 lb 9.6 oz (69.7 kg)    Physical Exam  Constitutional: She appears  well-developed and well-nourished. No distress.  HENT:  Head: Normocephalic and atraumatic.  Eyes: EOM are normal. No scleral icterus.  Neck: No thyromegaly present.  Cardiovascular: Normal rate, regular rhythm and normal heart sounds.  No murmur heard. Pulmonary/Chest: Effort normal and breath sounds normal. No respiratory distress. She has no wheezes.  Abdominal: Soft. Bowel sounds are normal. She exhibits no distension.  Musculoskeletal: Normal range of motion. She exhibits no edema.  Neurological: She is alert. She exhibits normal muscle tone.  Skin: Skin is warm and dry. She is not diaphoretic. No pallor.  Psychiatric: She has a normal mood and affect. Her behavior is normal. Judgment and thought content normal.    Results for orders placed or performed in visit on 12/06/16  Lipid panel  Result Value Ref Range   Cholesterol 330 (H) <200 mg/dL   HDL 36 (L) >40 mg/dL   Triglycerides 981 (H) <150 mg/dL   LDL Cholesterol (Calc) 228 (H) mg/dL (calc)   Total CHOL/HDL Ratio 9.2 (H) <5.0 (calc)   Non-HDL Cholesterol (Calc) 294 (H) <130 mg/dL (calc)  COMPLETE METABOLIC PANEL WITH GFR  Result Value Ref Range   Glucose, Bld 70 65 - 99 mg/dL   BUN 6 (L) 7 - 25 mg/dL   Creat 1.91 4.78 - 2.95 mg/dL   GFR, Est Non African American 84 > OR = 60 mL/min/1.87m2   GFR, Est African American 97 > OR = 60 mL/min/1.21m2   BUN/Creatinine Ratio 8 6 - 22 (calc)   Sodium 139 135 - 146 mmol/L   Potassium 4.6 3.5 - 5.3 mmol/L   Chloride 99 98 - 110 mmol/L   CO2 30 20 - 32 mmol/L   Calcium 9.7 8.6 - 10.4 mg/dL   Total Protein 7.3 6.1 - 8.1 g/dL   Albumin 4.5 3.6 - 5.1 g/dL   Globulin 2.8 1.9 - 3.7 g/dL (calc)   AG Ratio 1.6 1.0 - 2.5 (calc)   Total Bilirubin 0.3 0.2 - 1.2 mg/dL   Alkaline phosphatase (APISO) 104 33 - 130 U/L   AST 14 10 - 35 U/L   ALT 5 (L) 6 - 29 U/L      Assessment & Plan:   Problem List Items Addressed This Visit      Genitourinary   Chronic renal disease, stage II     Avoid NSAIDs and stay hydrated; check kidneys        Other  Tobacco use    Encouraged cessation; I am here to help if/when she wants to stop      Panic attack    Managed by psychiatrist      Dyslipidemia - Primary    Continue healthy diet, check lipids today; sounds like it may be familial      Relevant Orders   Lipid panel   Anxiety    Managed well by psychiatrist       Other Visit Diagnoses    Encounter for screening mammogram for breast cancer       Relevant Orders   MM DIGITAL SCREENING BILATERAL   Medication monitoring encounter       Relevant Orders   COMPLETE METABOLIC PANEL WITH GFR   Hypoglycemia       Relevant Orders   Hemoglobin A1c   Glucose tolerance, 3 hours       Follow up plan: Return for Medicare Wellness check.  An after-visit summary was printed and given to the patient at check-out.  Please see the patient instructions which may contain other information and recommendations beyond what is mentioned above in the assessment and plan.  No orders of the defined types were placed in this encounter.   Orders Placed This Encounter  Procedures  . MM DIGITAL SCREENING BILATERAL  . COMPLETE METABOLIC PANEL WITH GFR  . Hemoglobin A1c  . Lipid panel  . Glucose tolerance, 3 hours

## 2017-06-20 ENCOUNTER — Encounter: Payer: Self-pay | Admitting: Family Medicine

## 2017-08-21 ENCOUNTER — Other Ambulatory Visit: Payer: Self-pay | Admitting: Family Medicine

## 2017-08-21 ENCOUNTER — Ambulatory Visit (INDEPENDENT_AMBULATORY_CARE_PROVIDER_SITE_OTHER): Payer: Medicare Other | Admitting: Family Medicine

## 2017-08-21 ENCOUNTER — Ambulatory Visit (INDEPENDENT_AMBULATORY_CARE_PROVIDER_SITE_OTHER): Payer: Medicare Other

## 2017-08-21 VITALS — BP 104/62 | HR 64 | Temp 98.3°F | Resp 12 | Ht 65.0 in | Wt 158.0 lb

## 2017-08-21 VITALS — BP 104/62 | HR 64 | Temp 98.3°F | Resp 12 | Ht 65.0 in | Wt 158.4 lb

## 2017-08-21 DIAGNOSIS — H539 Unspecified visual disturbance: Secondary | ICD-10-CM

## 2017-08-21 DIAGNOSIS — R8290 Unspecified abnormal findings in urine: Secondary | ICD-10-CM

## 2017-08-21 DIAGNOSIS — R300 Dysuria: Secondary | ICD-10-CM | POA: Diagnosis not present

## 2017-08-21 DIAGNOSIS — R109 Unspecified abdominal pain: Secondary | ICD-10-CM

## 2017-08-21 DIAGNOSIS — R1030 Lower abdominal pain, unspecified: Secondary | ICD-10-CM

## 2017-08-21 DIAGNOSIS — H538 Other visual disturbances: Secondary | ICD-10-CM | POA: Diagnosis not present

## 2017-08-21 DIAGNOSIS — Z9181 History of falling: Secondary | ICD-10-CM | POA: Diagnosis not present

## 2017-08-21 DIAGNOSIS — R410 Disorientation, unspecified: Secondary | ICD-10-CM

## 2017-08-21 DIAGNOSIS — Z0000 Encounter for general adult medical examination without abnormal findings: Secondary | ICD-10-CM

## 2017-08-21 LAB — POCT URINALYSIS DIPSTICK
Bilirubin, UA: NEGATIVE
Blood, UA: NEGATIVE
Glucose, UA: NEGATIVE
Ketones, UA: NEGATIVE
Leukocytes, UA: NEGATIVE
Nitrite, UA: NEGATIVE
Odor: NORMAL
Protein, UA: NEGATIVE
Spec Grav, UA: 1.01 (ref 1.010–1.025)
Urobilinogen, UA: 0.2 E.U./dL
pH, UA: 8 (ref 5.0–8.0)

## 2017-08-21 MED ORDER — NITROFURANTOIN MONOHYD MACRO 100 MG PO CAPS: 100.0000 mg | ORAL_CAPSULE | Freq: Two times a day (BID) | ORAL | 0 refills | Status: DC

## 2017-08-21 MED ORDER — CIPROFLOXACIN HCL 500 MG PO TABS: 500.0000 mg | ORAL_TABLET | Freq: Two times a day (BID) | ORAL | 0 refills | Status: DC

## 2017-08-21 NOTE — Progress Notes (Signed)
Urine ordered, convert to visit if needed

## 2017-08-21 NOTE — Progress Notes (Signed)
BP 104/62   Pulse 64   Temp 98.3 F (36.8 C) (Oral)   Resp 12   Ht  (1.651 m)   Wt 158 lb (71.7 kg)   SpO2 97%   BMI 26.29 kg/m    Subjective:    Patient ID: Ann Pena, female    DOB: 1961-02-16, 57 y.o.   MRN: [id number]  HPI: Ann Pena is a 57 y.o. female  Chief Complaint  Patient presents with  . Urinary Tract Infection    flank pain and burning when she urinates, also pt states it was clody this am    HPI She is worked in today She says that she feels like she has a bladder infection; right side flank pain; pressure down below; back pain; urinary frequency, feels bad; "I know what it is, it's in my bladder"; fevers and getting really cold; nauseated; going on for 12 days; not confused, just out of sorts; no appetite; not sleeping; "my whole body is out of whack and I know why" and it's the bladder No intercourse in 9 years  Depression screen [facility] 2/9 08/21/2017 12/06/2016 08/31/2016 06/19/2016 10/07/2015  Decreased Interest 3 0 0 0 0  Down, Depressed, Hopeless 3 1 0 0 0  PHQ - 2 Score 6 1 0 0 0  Altered sleeping 3 - - - -  Tired, decreased energy 2 - - - -  Change in appetite 3 - - - -  Feeling bad or failure about yourself  3 - - - -  Trouble concentrating 3 - - - -  Moving slowly or fidgety/restless 0 - - - -  Suicidal thoughts 0 - - - -  PHQ-9 Score 20 - - - -  Difficult doing work/chores Extremely dIfficult - - - -    Relevant past medical, surgical, family and social history reviewed Past Medical History:  Diagnosis Date  . Anxiety state, unspecified   . Anxiety state, unspecified   . Asthma   . 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 Chest pain, unspecified   . Chronic kidney disease, stage III (moderate) (HCC)   . Depression   . Infective otitis externa, unspecified   . Obesity, unspecified   . Other and unspecified hyperlipidemia   . Other nonspecific abnormal serum enzyme levels   . Pain in joint, lower leg   . Pain  in limb   . Panic disorder without agoraphobia   . Posttraumatic stress disorder   . Shortness of breath   . Syncope and collapse   . Undiagnosed cardiac murmurs    Past Surgical History:  Procedure Laterality Date  . SHOULDER SURGERY    . TOTAL ABDOMINAL HYSTERECTOMY     Family History  Problem Relation Age of Onset  . Hypertension Mother   . COPD Mother   . Heart disease Mother   . Hypertension Father   . Bone cancer Father   . Heart attack Father   . Heart disease Father   . Thyroid disease Sister    Social History   Tobacco Use  . Smoking status: Current Every Day Smoker    Packs/day: 1.50    Years: 42.00    Pack years: 63.00    Types: Cigarettes  . Smokeless tobacco: Never Used  Substance Use Topics  . Alcohol use: No  . Drug use: No    Interim medical history since last visit reviewed. Allergies and medications reviewed  Review of Systems Per HPI  unless specifically indicated above     Objective:    BP 104/62   Pulse 64   Temp 98.3 F (36.8 C) (Oral)   Resp 12   Ht  (1.651 m)   Wt 158 lb (71.7 kg)   SpO2 97%   BMI 26.29 kg/m   Wt Readings from Last 3 Encounters:  08/21/17 158 lb (71.7 kg)  08/21/17 158 lb 6.4 oz (71.8 kg)  06/15/17 165 lb 1.6 oz (74.9 kg)    Physical Exam  Constitutional: She appears well-developed and well-nourished. No distress.  Cardiovascular: Normal rate and regular rhythm.  Pulmonary/Chest: Effort normal and breath sounds normal. No respiratory distress.  Abdominal: Soft. Bowel sounds are normal. She exhibits no distension. There is tenderness (suprapubic and RLQ). There is no rebound and no guarding.  Neurological: She is alert.  Skin: She is not diaphoretic.    Results for orders placed or performed in visit on 08/21/17  POCT urinalysis dipstick  Result Value Ref Range   Color, UA YELLOW    Clarity, UA CLEAR    Glucose, UA Negative Negative   Bilirubin, UA NEG    Ketones, UA NEG    Spec Grav, UA 1.010  1.010 - 1.025   Blood, UA NEG    pH, UA 8.0 5.0 - 8.0   Protein, UA Negative Negative   Urobilinogen, UA 0.2 0.2 or 1.0 E.U./dL   Nitrite, UA NEG    Leukocytes, UA Negative Negative   Appearance CLEAR    Odor NORMAL       Assessment & Plan:   Problem List Items Addressed This Visit    None    Visit Diagnoses    Burning with urination    -  Primary   urine dip shows high pH; suspect Proteus infection; reviewed allergies; will treat with cipro, culture pending; hydrate, to ER if worse, as ddx includes append.   Relevant Medications   ciprofloxacin (CIPRO) 500 MG tablet   Other Relevant Orders   POCT urinalysis dipstick (Completed)   Urine Culture   Lower abdominal pain       discussed ddx; she declined CT scan today; she will start ABX and go to ER if worse; explained appendicitis in ddx; she wishes to treat for possible UTI       Follow up plan: No follow-ups on file.  An after-visit summary was printed and given to the patient at check-out.  Please see the patient instructions which may contain other information and recommendations beyond what is mentioned above in the assessment and plan.  Meds ordered this encounter  Medications  . DISCONTD: nitrofurantoin, macrocrystal-monohydrate, (MACROBID) 100 MG capsule    Sig: Take 1 capsule (100 mg total) by mouth 2 (two) times daily.    Dispense:  14 capsule    Refill:  0  . DISCONTD: ciprofloxacin (CIPRO) 500 MG tablet    Sig: Take 1 tablet (500 mg total) by mouth 2 (two) times daily.    Dispense:  14 tablet    Refill:  0  . ciprofloxacin (CIPRO) 500 MG tablet    Sig: Take 1 tablet (500 mg total) by mouth 2 (two) times daily.    Dispense:  14 tablet    Refill:  0    CANCEL the macrobid Rx please and thank you!    Orders Placed This Encounter  Procedures  . Urine Culture  . POCT urinalysis dipstick

## 2017-08-21 NOTE — Patient Instructions (Signed)
Ann Pena , Thank you for taking time to come for your Medicare Wellness Visit. I appreciate your ongoing commitment to your health goals. Please review the following plan we discussed and let me know if I can assist you in the future.   Screening recommendations/referrals: Colorectal Screening: Completed 05/05/11. Repeat every 10 years Mammogram: Completed 08/28/13. Please call to schedule an appointment Bone Density: Not yet required Lung Cancer Screening: Declined Hepatitis C Screening: Completed 08/31/16  Vision and Dental Exams: Recommended annual ophthalmology exams for early detection of glaucoma and other disorders of the eye Recommended annual dental exams for proper oral hygiene  Vaccinations: Influenza vaccine: Overdue Pneumococcal vaccine: Not yet required Tdap vaccine: Declined. Please call your insurance company to determine your out of pocket expense. You may also receive this vaccine at your local pharmacy or Health Dept. Shingles vaccine: Please call your insurance company to determine your out of pocket expense for the Shingrix vaccine. You may also receive this vaccine at your local pharmacy or Health Dept.  Advanced directives: Advance directive discussed with you today. I have provided a copy for you to complete at home and have notarized. Once this is complete please bring a copy in to our office so we can scan it into your chart.  Conditions/risks identified: Recommend to drink at least 6-8 8oz glasses of water per day.  Next appointment: Please schedule your Annual Wellness Visit with your Nurse Health Advisor in one year.  Preventive Care 40-64 Years, Female Preventive care refers to lifestyle choices and visits with your health care provider that can promote health and wellness. What does preventive care include?  A yearly physical exam. This is also called an annual well check.  Dental exams once or twice a year.  Routine eye exams. Ask your health care  provider how often you should have your eyes checked.  Personal lifestyle choices, including:  Daily care of your teeth and gums.  Regular physical activity.  Eating a healthy diet.  Avoiding tobacco and drug use.  Limiting alcohol use.  Practicing safe sex.  Taking low-dose aspirin daily starting at age 75.  Taking vitamin and mineral supplements as recommended by your health care provider. What happens during an annual well check? The services and screenings done by your health care provider during your annual well check will depend on your age, overall health, lifestyle risk factors, and family history of disease. Counseling  Your health care provider may ask you questions about your:  Alcohol use.  Tobacco use.  Drug use.  Emotional well-being.  Home and relationship well-being.  Sexual activity.  Eating habits.  Work and work Statistician.  Method of birth control.  Menstrual cycle.  Pregnancy history. Screening  You may have the following tests or measurements:  Height, weight, and BMI.  Blood pressure.  Lipid and cholesterol levels. These may be checked every 5 years, or more frequently if you are over 37 years old.  Skin check.  Lung cancer screening. You may have this screening every year starting at age 1 if you have a 30-pack-year history of smoking and currently smoke or have quit within the past 15 years.  Fecal occult blood test (FOBT) of the stool. You may have this test every year starting at age 75.  Flexible sigmoidoscopy or colonoscopy. You may have a sigmoidoscopy every 5 years or a colonoscopy every 10 years starting at age 55.  Hepatitis C blood test.  Hepatitis B blood test.  Sexually transmitted disease (STD)  testing.  Diabetes screening. This is done by checking your blood sugar (glucose) after you have not eaten for a while (fasting). You may have this done every 1-3 years.  Mammogram. This may be done every 1-2 years.  Talk to your health care provider about when you should start having regular mammograms. This may depend on whether you have a family history of breast cancer.  BRCA-related cancer screening. This may be done if you have a family history of breast, ovarian, tubal, or peritoneal cancers.  Pelvic exam and Pap test. This may be done every 3 years starting at age 44. Starting at age 33, this may be done every 5 years if you have a Pap test in combination with an HPV test.  Bone density scan. This is done to screen for osteoporosis. You may have this scan if you are at high risk for osteoporosis. Discuss your test results, treatment options, and if necessary, the need for more tests with your health care provider. Vaccines  Your health care provider may recommend certain vaccines, such as:  Influenza vaccine. This is recommended every year.  Tetanus, diphtheria, and acellular pertussis (Tdap, Td) vaccine. You may need a Td booster every 10 years.  Zoster vaccine. You may need this after age 39.  Pneumococcal 13-valent conjugate (PCV13) vaccine. You may need this if you have certain conditions and were not previously vaccinated.  Pneumococcal polysaccharide (PPSV23) vaccine. You may need one or two doses if you smoke cigarettes or if you have certain conditions. Talk to your health care provider about which screenings and vaccines you need and how often you need them. This information is not intended to replace advice given to you by your health care provider. Make sure you discuss any questions you have with your health care provider. Document Released: 04/16/2015 Document Revised: 12/08/2015 Document Reviewed: 01/19/2015 Elsevier Interactive Patient Education  2017 [place] Prevention in the Home Falls can cause injuries. They can happen to people of all ages. There are many things you can do to make your home safe and to help prevent falls. What can I do on the outside of my  home?  Regularly fix the edges of walkways and driveways and fix any cracks.  Remove anything that might make you trip as you walk through a door, such as a raised step or threshold.  Trim any bushes or trees on the path to your home.  Use bright outdoor lighting.  Clear any walking paths of anything that might make someone trip, such as rocks or tools.  Regularly check to see if handrails are loose or broken. Make sure that both sides of any steps have handrails.  Any raised decks and porches should have guardrails on the edges.  Have any leaves, snow, or ice cleared regularly.  Use sand or salt on walking paths during winter.  Clean up any spills in your garage right away. This includes oil or grease spills. What can I do in the bathroom?  Use night lights.  Install grab bars by the toilet and in the tub and shower. Do not use towel bars as grab bars.  Use non-skid mats or decals in the tub or shower.  If you need to sit down in the shower, use a plastic, non-slip stool.  Keep the floor dry. Clean up any water that spills on the floor as soon as it happens.  Remove soap buildup in the tub or shower regularly.  Attach bath  mats securely with double-sided non-slip rug tape.  Do not have throw rugs and other things on the floor that can make you trip. What can I do in the bedroom?  Use night lights.  Make sure that you have a light by your bed that is easy to reach.  Do not use any sheets or blankets that are too big for your bed. They should not hang down onto the floor.  Have a firm chair that has side arms. You can use this for support while you get dressed.  Do not have throw rugs and other things on the floor that can make you trip. What can I do in the kitchen?  Clean up any spills right away.  Avoid walking on wet floors.  Keep items that you use a lot in easy-to-reach places.  If you need to reach something above you, use a strong step stool that has a  grab bar.  Keep electrical cords out of the way.  Do not use floor polish or wax that makes floors slippery. If you must use wax, use non-skid floor wax.  Do not have throw rugs and other things on the floor that can make you trip. What can I do with my stairs?  Do not leave any items on the stairs.  Make sure that there are handrails on both sides of the stairs and use them. Fix handrails that are broken or loose. Make sure that handrails are as long as the stairways.  Check any carpeting to make sure that it is firmly attached to the stairs. Fix any carpet that is loose or worn.  Avoid having throw rugs at the top or bottom of the stairs. If you do have throw rugs, attach them to the floor with carpet tape.  Make sure that you have a light switch at the top of the stairs and the bottom of the stairs. If you do not have them, ask someone to add them for you. What else can I do to help prevent falls?  Wear shoes that:  Do not have high heels.  Have rubber bottoms.  Are comfortable and fit you well.  Are closed at the toe. Do not wear sandals.  If you use a stepladder:  Make sure that it is fully opened. Do not climb a closed stepladder.  Make sure that both sides of the stepladder are locked into place.  Ask someone to hold it for you, if possible.  Clearly mark and make sure that you can see:  Any grab bars or handrails.  First and last steps.  Where the edge of each step is.  Use tools that help you move around (mobility aids) if they are needed. These include:  Canes.  Walkers.  Scooters.  Crutches.  Turn on the lights when you go into a dark area. Replace any light bulbs as soon as they burn out.  Set up your furniture so you have a clear path. Avoid moving your furniture around.  If any of your floors are uneven, fix them.  If there are any pets around you, be aware of where they are.  Review your medicines with your doctor. Some medicines can make  you feel dizzy. This can increase your chance of falling. Ask your doctor what other things that you can do to help prevent falls. This information is not intended to replace advice given to you by your health care provider. Make sure you discuss any questions you have with your  health care provider. Document Released: 01/14/2009 Document Revised: 08/26/2015 Document Reviewed: 04/24/2014 Elsevier Interactive Patient Education  2017 [employer].

## 2017-08-21 NOTE — Patient Instructions (Addendum)
Get plenty of rest and hydration Start the antibiotic If you get worse, then please go to the emergency department (thinking of appendicitis, other things) Stay hydrated

## 2017-08-21 NOTE — Progress Notes (Addendum)
Subjective:   Ann Pena is a 57 y.o. female who presents for Medicare Annual (Subsequent) preventive examination.  Review of Systems:  N/A Cardiac Risk Factors include: dyslipidemia;hypertension;sedentary lifestyle;smoking/ tobacco exposure     Objective:     Vitals: BP 104/62 (BP Location: Left Arm, Patient Position: Sitting, Cuff Size: Normal)   Pulse 64   Temp 98.3 F (36.8 C) (Oral)   Resp 12   Ht  (1.651 m)   Wt 158 lb 6.4 oz (71.8 kg)   SpO2 97%   BMI 26.36 kg/m   Body mass index is 26.36 kg/m.   Pt c/o flank pain, nausea over the past couple of days, cloudy urine after voiding this morning and overall fatigue/malaise. PHQ9 = 20. Vitals stable as indicated above. Discussed symptoms with Dr. Sherie Don. Per Dr. Sherie Don, will order POCT dip and would like to see pt today for further evaluation.   Advanced Directives 12/06/2016 08/31/2016 06/19/2016 10/07/2015 08/25/2015 07/26/2015 05/26/2015  Does Patient Have a Medical Advance Directive? No No No No No No No  Would patient like information on creating a medical advance directive? - - - No - patient declined information No - patient declined information No - patient declined information -    Tobacco Social History   Tobacco Use  Smoking Status Current Every Day Smoker  . Packs/day: 1.50  . Years: 42.00  . Pack years: 63.00  . Types: Cigarettes  Smokeless Tobacco Never Used     Ready to quit: Yes Counseling given: Yes  Clinical Intake:  Pre-visit preparation completed: Yes  Pain : No/denies pain   BMI - recorded: 26.36 Nutritional Status: BMI 25 -29 Overweight Nutritional Risks: None Diabetes: No  How often do you need to have someone help you when you read instructions, pamphlets, or other written materials from your doctor or pharmacy?: 1 - Never  Interpreter Needed?: No  Information entered by :: AEversole, LPN  Past Medical History:  Diagnosis Date  . Anxiety state, unspecified   . Anxiety state,  unspecified   . Asthma   . 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 Chest pain, unspecified   . Chronic kidney disease, stage III (moderate) (HCC)   . Depression   . Infective otitis externa, unspecified   . Obesity, unspecified   . Other and unspecified hyperlipidemia   . Other nonspecific abnormal serum enzyme levels   . Pain in joint, lower leg   . Pain in limb   . Panic disorder without agoraphobia   . Posttraumatic stress disorder   . Shortness of breath   . Syncope and collapse   . Undiagnosed cardiac murmurs    Past Surgical History:  Procedure Laterality Date  . SHOULDER SURGERY    . TOTAL ABDOMINAL HYSTERECTOMY     Family History  Problem Relation Age of Onset  . Hypertension Mother   . COPD Mother   . Heart disease Mother   . Hypertension Father   . Bone cancer Father   . Heart attack Father   . Heart disease Father   . Thyroid disease Sister    Social History   Socioeconomic History  . Marital status: Divorced    Spouse name: Not on file  . Number of children: 1  . Years of education: Not on file  . Highest education level: 12th grade  Occupational History  . Occupation: Disabled  Social Needs  . Financial resource strain: Not hard at all  . Food insecurity:  Worry: Never true    Inability: Never true  . Transportation needs:    Medical: No    Non-medical: No  Tobacco Use  . Smoking status: Current Every Day Smoker    Packs/day: 1.50    Years: 42.00    Pack years: 63.00    Types: Cigarettes  . Smokeless tobacco: Never Used  Substance and Sexual Activity  . Alcohol use: No  . Drug use: No  . Sexual activity: Not Currently    Birth control/protection: Abstinence  Lifestyle  . Physical activity:    Days per week: 0 days    Minutes per session: 0 min  . Stress: Very much  Relationships  . Social connections:    Talks on phone: Patient refused    Gets together: Patient refused    Attends religious service: Patient refused     Active member of club or organization: Patient refused    Attends meetings of clubs or organizations: Patient refused    Relationship status: Divorced  Other Topics Concern  . Not on file  Social History Narrative  . Not on file    Outpatient Encounter Medications as of 08/21/2017  Medication Sig  . ALPRAZolam (XANAX) 1 MG tablet Take 1.5 mg by mouth 4 (four) times daily.   . citalopram (CELEXA) 40 MG tablet Take 1 tablet by mouth daily.  . mirtazapine (REMERON) 45 MG tablet Take 1 tablet by mouth at bedtime.  Marland Kitchen omeprazole (PRILOSEC) 40 MG capsule Take 1 capsule (40 mg total) by mouth daily.  Marland Kitchen albuterol (PROVENTIL) (2.5 MG/3ML) 0.083% nebulizer solution Take 3 mLs (2.5 mg total) by nebulization every 6 (six) hours as needed for wheezing or shortness of breath. (Patient not taking: Reported on 08/21/2017)   No facility-administered encounter medications on file as of 08/21/2017.     Activities of Daily Living In your present state of health, do you have any difficulty performing the following activities: 08/21/2017 12/06/2016  Hearing? N Y  Comment tinnitus; denies hearing aids -  Vision? N N  Comment wears eyeglasses; blurred vision; poor night vision -  Difficulty concentrating or making decisions? Y N  Comment short term memory loss -  Walking or climbing stairs? Y Y  Comment knee pain, dyspnea, low back pain SOB  Dressing or bathing? N N  Doing errands, shopping? N N  Preparing Food and eating ? N -  Comment upper dentures -  Using the Toilet? N -  In the past six months, have you accidently leaked urine? Y -  Comment urgency -  Do you have problems with loss of bowel control? N -  Managing your Medications? N -  Managing your Finances? N -  Housekeeping or managing your Housekeeping? N -  Some recent data might be hidden    Patient Care Team: Lada, Janit Bern, MD as PCP - General (Family Medicine)    Assessment:   This is a routine wellness examination for  Ann Pena.  Exercise Activities and Dietary recommendations Current Exercise Habits: The patient does not participate in regular exercise at present, Exercise limited by: None identified  Goals    . DIET - INCREASE WATER INTAKE     Recommend to drink at least 6-8 8oz glasses of water per day.       Fall Risk Fall Risk  08/21/2017 12/06/2016 08/31/2016 06/19/2016 10/07/2015  Falls in the past year? Yes No No No No  Comment doing yard work, got dizzy and fell - - - -  Number falls in past yr: 1 - - - -  Injury with Fall? No - - - -  Risk for fall due to : Impaired vision - - - -  Risk for fall due to: Comment wears eyeglasses;  - - - -  Follow up Falls evaluation completed;Education provided;Falls prevention discussed - - - -   FALL RISK PREVENTION PERTAINING TO HOME: Is your home free of loose throw rugs in walkways, pet beds, electrical cords, etc? Yes Is there adequate lighting in your home to reduce risk of falls?  Yes Are there stairs in or around your home WITH handrails? Yes  ASSISTIVE DEVICES UTILIZED TO PREVENT FALLS: Use of a cane, walker or w/c? No Grab bars in the bathroom? No  Shower chair or a place to sit while bathing? No An elevated toilet seat or a handicapped toilet? Yes  Timed Get Up and Go Performed: Yes. Pt ambulated 10 feet within 5 sec. Gait stead-fast and without the use of an assistive device. No intervention required at this time. Fall risk prevention has been discussed.  Community Resource Referral:  [employer] Referral not required at this time. OR Community Resource Referral sent to Care Guide for installation of grab bars in the shower, shower chair or an elevated toilet seat. OR Pt declined my offer to send Community Resource Referral to Care Guide for installation of grab bars in the shower, shower chair or an elevated toilet seat.  Depression Screen PHQ 2/9 Scores 08/21/2017 12/06/2016 08/31/2016 06/19/2016  PHQ - 2 Score 6 1 0 0  PHQ- 9 Score 20  - - -     Cognitive Function     6CIT Screen 08/21/2017  What Year? 0 points  What month? 0 points  What time? 0 points  Count back from 20 0 points  Months in reverse 2 points  Repeat phrase 6 points  Total Score 8    Immunization History  Administered Date(s) Administered  . Influenza,inj,Quad PF,6+ Mos 01/20/2014  . Influenza-Unspecified 03/18/2012, 01/20/2014    Qualifies for Shingles Vaccine? Yes. Due for Shingrix. Education has been provided regarding the importance of this vaccine. Pt has been advised to call her insurance company to determine her out of pocket expense. Advised she may also receive this vaccine at her local pharmacy or Health Dept. Verbalized acceptance and understanding.  Overdue for Flu vaccine. Education has been provided regarding the importance of this vaccine and advised to receive when available. Verbalized acceptance and understanding.  Due for Tdap vaccine. Education has been provided regarding the importance of this vaccine. Pt has been advised she may receive this vaccine at her local pharmacy or Health Dept. Also advised to provide a copy of her vaccination record if she chooses to receive this vaccine at her local pharmacy. Verbalized acceptance and understanding.  Screening Tests Health Maintenance  Topic Date Due  . MAMMOGRAM  08/20/1978  . PAP SMEAR  08/19/1981  . TETANUS/TDAP  09/01/2017 (Originally 08/20/1979)  . INFLUENZA VACCINE  02/15/2018 (Originally 11/01/2017)  . HIV Screening  06/16/2018 (Originally 08/20/1975)  . COLONOSCOPY  05/04/2021  . Hepatitis C Screening  Completed    Cancer Screenings: Lung: Low Dose CT Chest recommended if Age 86-80 years, 30 pack-year currently smoking OR have quit w/in 15years. Patient does qualify. Declined my offer to be referred to Glenna Fellows, RN for lung cancer screening. Breast:  Up to date on Mammogram? No. Completed 08/28/13. Dr. Sherie Don ordered on 06/15/17 but unable to locate report.  Pt confirmed  she has not yet completed her exam. Provided with contact information and advised to schedule appointment.   Bone Density/Dexa: Not yet required Colorectal: Completed 05/05/11. Repeat every 10 years.  Additional Screenings: Hepatitis C Screening: Completed 08/31/16    Plan:  I have personally reviewed and addressed the Medicare Annual Wellness questionnaire and have noted the following in the patient's chart:  A. Medical and social history B. Use of alcohol, tobacco or illicit drugs  C. Current medications and supplements D. Functional ability and status E.  Nutritional status F.  Physical activity G. Advance directives H. List of other physicians I.  Hospitalizations, surgeries, and ER visits in previous 12 months J.  Vitals K. Screenings such as hearing and vision if needed, cognitive and depression L. Referrals and appointments  In addition, I have reviewed and discussed with patient certain preventive protocols, quality metrics, and best practice recommendations. A written personalized care plan for preventive services as well as general preventive health recommendations were provided to patient.  See attached scanned questionnaire for additional information.   Signed,  Deon Pilling, LPN Nurse Health Advisor

## 2017-08-21 NOTE — Addendum Note (Signed)
Addended by: Peterson Ao, Cortlin Marano J on: 08/21/2017 02:12 PM   Modules accepted: Orders

## 2017-08-22 ENCOUNTER — Encounter: Payer: Medicare Other | Admitting: Family Medicine

## 2017-08-22 LAB — URINE CULTURE
MICRO NUMBER:: 90618083
SPECIMEN QUALITY:: ADEQUATE

## 2017-09-20 ENCOUNTER — Encounter: Payer: Self-pay | Admitting: Family Medicine

## 2017-09-20 ENCOUNTER — Ambulatory Visit (INDEPENDENT_AMBULATORY_CARE_PROVIDER_SITE_OTHER): Payer: Medicare Other | Admitting: Family Medicine

## 2017-09-20 VITALS — BP 108/64 | HR 80 | Temp 97.8°F | Resp 14 | Ht 65.0 in | Wt 158.1 lb

## 2017-09-20 DIAGNOSIS — I700 Atherosclerosis of aorta: Secondary | ICD-10-CM

## 2017-09-20 DIAGNOSIS — Z720 Tobacco use: Secondary | ICD-10-CM

## 2017-09-20 DIAGNOSIS — R1011 Right upper quadrant pain: Secondary | ICD-10-CM | POA: Diagnosis not present

## 2017-09-20 DIAGNOSIS — R079 Chest pain, unspecified: Secondary | ICD-10-CM

## 2017-09-20 DIAGNOSIS — E785 Hyperlipidemia, unspecified: Secondary | ICD-10-CM | POA: Diagnosis not present

## 2017-09-20 DIAGNOSIS — N182 Chronic kidney disease, stage 2 (mild): Secondary | ICD-10-CM | POA: Diagnosis not present

## 2017-09-20 DIAGNOSIS — I10 Essential (primary) hypertension: Secondary | ICD-10-CM | POA: Diagnosis not present

## 2017-09-20 DIAGNOSIS — R7303 Prediabetes: Secondary | ICD-10-CM | POA: Diagnosis not present

## 2017-09-20 DIAGNOSIS — R0989 Other specified symptoms and signs involving the circulatory and respiratory systems: Secondary | ICD-10-CM | POA: Diagnosis not present

## 2017-09-20 DIAGNOSIS — Z5181 Encounter for therapeutic drug level monitoring: Secondary | ICD-10-CM | POA: Diagnosis not present

## 2017-09-20 HISTORY — DX: Prediabetes: R73.03

## 2017-09-20 HISTORY — DX: Atherosclerosis of aorta: I70.0

## 2017-09-20 MED ORDER — ALBUTEROL SULFATE HFA 108 (90 BASE) MCG/ACT IN AERS: 2.0000 | INHALATION_SPRAY | RESPIRATORY_TRACT | 1 refills | Status: DC | PRN

## 2017-09-20 NOTE — Patient Instructions (Addendum)
Please call [phone] to schedule your imaging test, ultrasound of your liver and gallbladder Please wait 2-3 days after the order has been placed to call and get your test scheduled  I do encourage you to quit smoking Call [phone] to sign up for smoking cessation classes You can call 1-800-QUIT-NOW to talk with a smoking cessation coach   Health Risks of Smoking Smoking cigarettes is very bad for your health. Tobacco smoke has over 200 known poisons in it. It contains the poisonous gases nitrogen oxide and carbon monoxide. There are over 60 chemicals in tobacco smoke that cause cancer. Smoking is difficult to quit because a chemical in tobacco, called nicotine, causes addiction or dependence. When you smoke and inhale, nicotine is absorbed rapidly into the bloodstream through your lungs. Both inhaled and non-inhaled nicotine may be addictive. What are the risks of cigarette smoke? Cigarette smokers have an increased risk of many serious medical problems, including:  Lung cancer.  Lung disease, such as pneumonia, bronchitis, and emphysema.  Chest pain (angina) and heart attack because the heart is not getting enough oxygen.  Heart disease and peripheral blood vessel disease.  High blood pressure (hypertension).  Stroke.  Oral cancer, including cancer of the lip, mouth, or voice box.  Bladder cancer.  Pancreatic cancer.  Cervical cancer.  Pregnancy complications, including premature birth.  Stillbirths and smaller newborn babies, birth defects, and genetic damage to sperm.  Early menopause.  Lower estrogen level for women.  Infertility.  Facial wrinkles.  Blindness.  Increased risk of broken bones (fractures).  Senile dementia.  Stomach ulcers and internal bleeding.  Delayed wound healing and increased risk of complications during surgery.  Even smoking lightly shortens your life expectancy by several years.  Because of secondhand smoke exposure,  children of smokers have an increased risk of the following:  Sudden infant death syndrome (SIDS).  Respiratory infections.  Lung cancer.  Heart disease.  Ear infections.  What are the benefits of quitting? There are many health benefits of quitting smoking. Here are some of them:  Within days of quitting smoking, your risk of having a heart attack decreases, your blood flow improves, and your lung capacity improves. Blood pressure, pulse rate, and breathing patterns start returning to normal soon after quitting.  Within months, your lungs may clear up completely.  Quitting for 10 years reduces your risk of developing lung cancer and heart disease to almost that of a nonsmoker.  People who quit may see an improvement in their overall quality of life.  How do I quit smoking? Smoking is an addiction with both physical and psychological effects, and longtime habits can be hard to change. Your health care provider can recommend:  Programs and community resources, which may include group support, education, or talk therapy.  Prescription medicines to help reduce cravings.  Nicotine replacement products, such as patches, gum, and nasal sprays. Use these products only as directed. Do not replace cigarette smoking with electronic cigarettes, which are commonly called e-cigarettes. The safety of e-cigarettes is not known, and some may contain harmful chemicals.  A combination of two or more of these methods.  Where to find more information:  American Lung Association: www.lung.org  American Cancer Society: www.cancer.org Summary  Smoking cigarettes is very bad for your health. Cigarette smokers have an increased risk of many serious medical problems, including several cancers, heart disease, and stroke.  Smoking is an addiction with both physical and psychological effects, and longtime habits can be hard to  change.  By stopping right away, you can greatly reduce the risk of  medical problems for you and your family.  To help you quit smoking, your health care provider can recommend programs, community resources, prescription medicines, and nicotine replacement products such as patches, gum, and nasal sprays. This information is not intended to replace advice given to you by your health care provider. Make sure you discuss any questions you have with your health care provider. Document Released: 04/27/2004 Document Revised: 03/24/2016 Document Reviewed: 03/24/2016 Elsevier Interactive Patient Education  2017 [employer].  Steps to Quit Smoking Smoking tobacco can be bad for your health. It can also affect almost every organ in your body. Smoking puts you and people around you at risk for many serious long-lasting (chronic) diseases. Quitting smoking is hard, but it is one of the best things that you can do for your health. It is never too late to quit. What are the benefits of quitting smoking? When you quit smoking, you lower your risk for getting serious diseases and conditions. They can include:  Lung cancer or lung disease.  Heart disease.  Stroke.  Heart attack.  Not being able to have children (infertility).  Weak bones (osteoporosis) and broken bones (fractures).  If you have coughing, wheezing, and shortness of breath, those symptoms may get better when you quit. You may also get sick less often. If you are pregnant, quitting smoking can help to lower your chances of having a baby of low birth weight. What can I do to help me quit smoking? Talk with your doctor about what can help you quit smoking. Some things you can do (strategies) include:  Quitting smoking totally, instead of slowly cutting back how much you smoke over a period of time.  Going to in-person counseling. You are more likely to quit if you go to many counseling sessions.  Using resources and support systems, such as: ? Agricultural engineer with a Veterinary surgeon. ? Phone  quitlines. ? Automotive engineer. ? Support groups or group counseling. ? Text messaging programs. ? Mobile phone apps or applications.  Taking medicines. Some of these medicines may have nicotine in them. If you are pregnant or breastfeeding, do not take any medicines to quit smoking unless your doctor says it is okay. Talk with your doctor about counseling or other things that can help you.  Talk with your doctor about using more than one strategy at the same time, such as taking medicines while you are also going to in-person counseling. This can help make quitting easier. What things can I do to make it easier to quit? Quitting smoking might feel very hard at first, but there is a lot that you can do to make it easier. Take these steps:  Talk to your family and friends. Ask them to support and encourage you.  Call phone quitlines, reach out to support groups, or work with a Veterinary surgeon.  Ask people who smoke to not smoke around you.  Avoid places that make you want (trigger) to smoke, such as: ? Bars. ? Parties. ? Smoke-break areas at work.  Spend time with people who do not smoke.  Lower the stress in your life. Stress can make you want to smoke. Try these things to help your stress: ? Getting regular exercise. ? Deep-breathing exercises. ? Yoga. ? Meditating. ? Doing a body scan. To do this, close your eyes, focus on one area of your body at a time from head to toe, and notice  which parts of your body are tense. Try to relax the muscles in those areas.  Download or buy apps on your mobile phone or tablet that can help you stick to your quit plan. There are many free apps, such as QuitGuide from the [employer] Office manager for Disease Control and Prevention). You can find more support from smokefree.gov and other websites.  This information is not intended to replace advice given to you by your health care provider. Make sure you discuss any questions you have with your health care  provider. Document Released: 01/14/2009 Document Revised: 11/16/2015 Document Reviewed: 08/04/2014 Elsevier Interactive Patient Education  2018 [employer].

## 2017-09-20 NOTE — Assessment & Plan Note (Signed)
Advised this is probably secondary to her smoking and high cholesterol; encouraged smoking cessation

## 2017-09-20 NOTE — Assessment & Plan Note (Signed)
Check creatinine; controlled

## 2017-09-20 NOTE — Assessment & Plan Note (Signed)
Offered help with quitting; advised of dangers, including artery damage, heart attacks, leukemia, strokes, etc.

## 2017-09-20 NOTE — Progress Notes (Signed)
BP 108/64   Pulse 80   Temp 97.8 F (36.6 C) (Oral)   Resp 14   Ht 5\' 5"  (1.651 m)   Wt 158 lb 1.6 oz (71.7 kg)   SpO2 95%   BMI 26.31 kg/m    Subjective:    Patient ID: Ann Pena, female    DOB: July 25, 1960, 57 y.o.   MRN: [id number]  HPI: Ann Pena is a 57 y.o. female  Chief Complaint  Patient presents with  . Follow-up  . Medication Refill    HPI Patient is here for routine f/u  Last regular follow-up was on June 15, 2017  High cholesterol; used to be over 300; tries to avoid bacon and sausage; she did not get the labs done that were ordered in March; patient opted to not do the bloodwork that day Lab Results  Component Value Date   CHOL 330 (H) 12/06/2016   HDL 36 (L) 12/06/2016   LDLCALC 228 (H) 12/06/2016   TRIG 388 (H) 12/06/2016   CHOLHDL 9.2 (H) 12/06/2016   Prediabetes; last A1c was 5.7 over a year ago, march 2018; she did not get the labs done in march of this year  CKD stage 3 listed; she did not get the last done that were ordered in March; not taking NSAID; good water drinker  Seeing Dr. Marguerite Olea at [facility] for her medicines and psychiatric health  She just wants her liver checked; has had excruciating pain from time to time; heating pads ease it off; RUQ pain; like a piercing knife, then it goes and then just nags; has happened several times for the last year; changes with eating; if she eats chocolate, that will aggravate it; has hiatal hernia and previous doctor aware  Heart murmur; had echo with cardiologist, Dr. Kirke Corin; echo was "fine" but had mild calcifications of the aortic valve; she said could that cause "pings" in her heart; feels like her heart has been shocked; just a little ping or ding, then goes away; might do it two or three times, then maybe right before going to bed  She mentioned a refill of gabapentin, but it's not on her list; she says it gave her nightmares so she's okay not getting the refill  Depression screen [facility]  2/9 09/20/2017 08/21/2017 12/06/2016 08/31/2016 06/19/2016  Decreased Interest 0 3 0 0 0  Down, Depressed, Hopeless 3 3 1  0 0  PHQ - 2 Score 3 6 1  0 0  Altered sleeping 3 3 - - -  Tired, decreased energy 3 2 - - -  Change in appetite 3 3 - - -  Feeling bad or failure about yourself  3 3 - - -  Trouble concentrating 3 3 - - -  Moving slowly or fidgety/restless 0 0 - - -  Suicidal thoughts 0 0 - - -  PHQ-9 Score 18 20 - - -  Difficult doing work/chores Somewhat difficult Extremely dIfficult - - -    Relevant past medical, surgical, family and social history reviewed Past Medical History:  Diagnosis Date  . Anxiety state, unspecified   . Anxiety state, unspecified   . Asthma   . 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 Chest pain, unspecified   . Chronic kidney disease, stage III (moderate) (HCC)   . Depression   . Infective otitis externa, unspecified   . Obesity, unspecified   . Other and unspecified hyperlipidemia   . Other nonspecific abnormal serum enzyme levels   .  Pain in joint, lower leg   . Pain in limb   . Panic disorder without agoraphobia   . Posttraumatic stress disorder   . Shortness of breath   . Syncope and collapse   . Undiagnosed cardiac murmurs    Past Surgical History:  Procedure Laterality Date  . SHOULDER SURGERY    . TOTAL ABDOMINAL HYSTERECTOMY     Family History  Problem Relation Age of Onset  . Hypertension Mother   . COPD Mother   . Heart disease Mother   . Hypertension Father   . Bone cancer Father   . Heart attack Father   . Heart disease Father   . Thyroid disease Sister    Social History   Tobacco Use  . Smoking status: Current Every Day Smoker    Packs/day: 1.50    Years: 42.00    Pack years: 63.00    Types: Cigarettes  . Smokeless tobacco: Never Used  Substance Use Topics  . Alcohol use: No  . Drug use: No    Interim medical history since last visit reviewed. Allergies and medications reviewed  Review of  Systems Per HPI unless specifically indicated above     Objective:    BP 108/64   Pulse 80   Temp 97.8 F (36.6 C) (Oral)   Resp 14   Ht 5\' 5"  (1.651 m)   Wt 158 lb 1.6 oz (71.7 kg)   SpO2 95%   BMI 26.31 kg/m   Wt Readings from Last 3 Encounters:  09/20/17 158 lb 1.6 oz (71.7 kg)  08/21/17 158 lb (71.7 kg)  08/21/17 158 lb 6.4 oz (71.8 kg)    Physical Exam  Constitutional: She appears well-developed and well-nourished. No distress.  HENT:  Head: Normocephalic and atraumatic.  Eyes: EOM are normal. No scleral icterus.  Neck: No thyromegaly present.  Cardiovascular: Normal rate, regular rhythm and normal heart sounds.  No murmur heard. Pulmonary/Chest: Effort normal and breath sounds normal. No respiratory distress. She has no wheezes.  Abdominal: Soft. Bowel sounds are normal. She exhibits no distension. There is tenderness (ruq).  Musculoskeletal: Normal range of motion. She exhibits no edema.  Neurological: She is alert. She exhibits normal muscle tone.  Skin: Skin is warm and dry. She is not diaphoretic. No pallor.  Psychiatric: She has a normal mood and affect. Her behavior is normal. Judgment and thought content normal.    Results for orders placed or performed in visit on 08/21/17  Urine Culture  Result Value Ref Range   MICRO NUMBER: [id number]    SPECIMEN QUALITY: ADEQUATE    Sample Source URINE    STATUS: FINAL    ISOLATE 1:      Single organism less than 10,000 CFU/mL isolated. These organisms, commonly found on external and internal genitalia, are considered colonizers. No further testing performed.  POCT urinalysis dipstick  Result Value Ref Range   Color, UA YELLOW    Clarity, UA CLEAR    Glucose, UA Negative Negative   Bilirubin, UA NEG    Ketones, UA NEG    Spec Grav, UA 1.010 1.010 - 1.025   Blood, UA NEG    pH, UA 8.0 5.0 - 8.0   Protein, UA Negative Negative   Urobilinogen, UA 0.2 0.2 or 1.0 E.U./dL   Nitrite, UA NEG    Leukocytes, UA  Negative Negative   Appearance CLEAR    Odor NORMAL       Assessment & Plan:   Problem List Items  Addressed This Visit      Cardiovascular and Mediastinum   Hypertension - Primary    Check creatinine; controlled      Aortic calcification (HCC)    Advised this is probably secondary to her smoking and high cholesterol; encouraged smoking cessation      Relevant Orders   Ambulatory referral to Cardiology     Genitourinary   Chronic renal disease, stage II    Check kidneys; avoid NSAIDs; good water drinker        Other   Tobacco use    Offered help with quitting; advised of dangers, including artery damage, heart attacks, leukemia, strokes, etc.      Relevant Orders   CT CHEST LUNG CA SCREEN LOW DOSE W/O CM   Prediabetes   Relevant Orders   Hemoglobin A1c   Pain in the chest    Seen by Dr. Kirke CorinArida; will get her back to him      Relevant Orders   Ambulatory referral to Cardiology   Dyslipidemia    Check fasting lipids; try to avoid saturated fats      Relevant Orders   Lipid panel    Other Visit Diagnoses    RUQ pain       check LFTs, order RUQ US; patient to call and schedule her own US   Relevant Orders   US ABDOMEN LIMITED RUQ   Chest wall symptom or complaint       previously seen by cardiologist for same; will get her back; she does have multiple cardiac risk factors   Medication monitoring encounter       Relevant Orders   COMPLETE METABOLIC PANEL WITH GFR       Follow up plan: Return in about 3 months (around 12/21/2017) for follow-up visit with Dr. Sherie DonLada.  An after-visit summary was printed and given to the patient at check-out.  Please see the patient instructions which may contain other information and recommendations beyond what is mentioned above in the assessment and plan.  Meds ordered this encounter  Medications  . albuterol (PROVENTIL HFA;VENTOLIN HFA) 108 (90 Base) MCG/ACT inhaler    Sig: Inhale 2 puffs into the lungs every 4 (four) hours  as needed for wheezing or shortness of breath.    Dispense:  1 Inhaler    Refill:  1    Orders Placed This Encounter  Procedures  . US ABDOMEN LIMITED RUQ  . CT CHEST LUNG CA SCREEN LOW DOSE W/O CM  . Lipid panel  . Hemoglobin A1c  . COMPLETE METABOLIC PANEL WITH GFR  . Ambulatory referral to Cardiology

## 2017-09-20 NOTE — Assessment & Plan Note (Signed)
Check kidneys; avoid NSAIDs; good water drinker

## 2017-09-20 NOTE — Assessment & Plan Note (Signed)
Check fasting lipids; try to avoid saturated fats 

## 2017-09-20 NOTE — Assessment & Plan Note (Signed)
Seen by Dr. Kirke CorinArida; will get her back to him

## 2017-09-21 ENCOUNTER — Other Ambulatory Visit: Payer: Self-pay | Admitting: Family Medicine

## 2017-09-21 DIAGNOSIS — Z5181 Encounter for therapeutic drug level monitoring: Secondary | ICD-10-CM

## 2017-09-21 DIAGNOSIS — E785 Hyperlipidemia, unspecified: Secondary | ICD-10-CM

## 2017-09-21 LAB — COMPLETE METABOLIC PANEL WITH GFR
AG Ratio: 1.5 (calc) (ref 1.0–2.5)
ALT: 6 U/L (ref 6–29)
AST: 15 U/L (ref 10–35)
Albumin: 4.2 g/dL (ref 3.6–5.1)
Alkaline phosphatase (APISO): 115 U/L (ref 33–130)
BUN/Creatinine Ratio: 5 (calc) — ABNORMAL LOW (ref 6–22)
BUN: 6 mg/dL — ABNORMAL LOW (ref 7–25)
CO2: 30 mmol/L (ref 20–32)
Calcium: 9.4 mg/dL (ref 8.6–10.4)
Chloride: 102 mmol/L (ref 98–110)
Creat: 1.1 mg/dL — ABNORMAL HIGH (ref 0.50–1.05)
GFR, Est African American: 65 mL/min/{1.73_m2} (ref >=60)
GFR, Est Non African American: 56 mL/min/{1.73_m2} — ABNORMAL LOW (ref >=60)
Globulin: 2.8 g/dL (calc) (ref 1.9–3.7)
Glucose, Bld: 84 mg/dL (ref 65–99)
Potassium: 4.4 mmol/L (ref 3.5–5.3)
Sodium: 139 mmol/L (ref 135–146)
Total Bilirubin: 0.3 mg/dL (ref 0.2–1.2)
Total Protein: 7 g/dL (ref 6.1–8.1)

## 2017-09-21 LAB — HEMOGLOBIN A1C
Hgb A1c MFr Bld: 5.2 % of total Hgb (ref <5.7)
Mean Plasma Glucose: 103 (calc)
eAG (mmol/L): 5.7 (calc)

## 2017-09-21 LAB — LIPID PANEL
Cholesterol: 308 mg/dL — ABNORMAL HIGH (ref <200)
HDL: 42 mg/dL — ABNORMAL LOW (ref >50)
LDL Cholesterol (Calc): 233 mg/dL (calc) — ABNORMAL HIGH
Non-HDL Cholesterol (Calc): 266 mg/dL (calc) — ABNORMAL HIGH (ref <130)
Total CHOL/HDL Ratio: 7.3 (calc) — ABNORMAL HIGH (ref <5.0)
Triglycerides: 161 mg/dL — ABNORMAL HIGH (ref <150)

## 2017-09-21 MED ORDER — ROSUVASTATIN CALCIUM 40 MG PO TABS: 40.0000 mg | ORAL_TABLET | Freq: Every day | ORAL | 0 refills | Status: DC

## 2017-09-21 MED ORDER — EZETIMIBE 10 MG PO TABS
10.0000 mg | ORAL_TABLET | Freq: Every day | ORAL | 0 refills | Status: DC
Start: 2017-09-21 — End: 2017-12-27

## 2017-09-21 NOTE — Progress Notes (Signed)
Refer to endo (lipid specialist) Start crestor 40 and zetia 10 Recheck lipids in 6 weeks, around August 6th

## 2017-09-24 ENCOUNTER — Other Ambulatory Visit: Payer: Self-pay | Admitting: Family Medicine

## 2017-09-24 ENCOUNTER — Telehealth: Payer: Self-pay | Admitting: *Deleted

## 2017-09-24 DIAGNOSIS — Z122 Encounter for screening for malignant neoplasm of respiratory organs: Secondary | ICD-10-CM

## 2017-09-24 DIAGNOSIS — Z87891 Personal history of nicotine dependence: Secondary | ICD-10-CM

## 2017-09-24 NOTE — Telephone Encounter (Signed)
Received referral for initial lung cancer screening scan. Contacted patient and obtained smoking history,(current, 42 pack year) as well as answering questions related to screening process. Patient denies signs of lung cancer such as weight loss or hemoptysis. Patient denies comorbidity that would prevent curative treatment if lung cancer were found. Patient is scheduled for shared decision making visit and CT scan on 10/05/17.

## 2017-09-24 NOTE — Progress Notes (Signed)
Patient refuses to take statins; will still have her take Zetia and see endo (lipid specialist)

## 2017-09-24 NOTE — Telephone Encounter (Signed)
Received referral for low dose lung cancer screening CT scan. Message left at phone number listed in EMR for patient to call me back to facilitate scheduling scan.  

## 2017-09-25 ENCOUNTER — Ambulatory Visit: Payer: Self-pay | Admitting: *Deleted

## 2017-09-25 NOTE — Telephone Encounter (Signed)
Pt called to discuss her taking statins. She again stated she did not want to take the statins, but would start the Zetia. She also stated that she felt really tired and fatigued.  She talked about her son, who was murdered in 2011 and how she wish she didn't have to deal with anything. She wish she could go on. So when I asked further what she meant, she stated that she was not about to kill her self, because her step father and brother had killed them selves and she would not wish suicide on anyone. She stated that she did not have a plan and would not kill herself.  She kept saying she was tired. So I asked her if we could talk about that at first she said yes and then she said the sister will be there in about 30 mins to stay with her. She said that she did not want to talk with me right now, she was going to pick up her medication and her sister would be staying with her today.  Advised patient to call back if she felt like talking and she stated she would.. Pt was not triaged.  [facility].

## 2017-09-25 NOTE — Telephone Encounter (Signed)
Please encourage patient to call her psychiatrist today Make sure she is aware of resources and crisis numbers  ------------------------------------------------- Here are some resources to help you if you feel you are in a mental health crisis:  National Suicide Prevention Lifeline - Call [phone]  for help - Website with more resources: ARanked.fihttps://suicidepreventionlifeline.org/  [facility] Crisis [facility] - Call [phone](220)[phone] for help. - Mobile Crisis Program available 24 hours a day, 365 days a year. - Available for anyone of any age in [place].  [facility] - Address: [address], [place] - Telephone: [phone]  - Hours of Operation: Sunday - Saturday - 8:00 a.m. - 8:00 p.m. - Medicaid, Medicare (Government Issued Only), BCBS, and [employer] - Crisis Management, Outpatient Individual & Group Therapy, Psychiatrists on-site to provide medication management, In-Home Psychiatric Care, and Peer Support Care.  [facility] - Call [phone] for help. - Mobile Crisis Program available 24 hours a day, 365 days a year. - Available for anyone of any age in [place]

## 2017-09-25 NOTE — Telephone Encounter (Signed)
Pt.notified

## 2017-10-01 ENCOUNTER — Ambulatory Visit
Admission: RE | Admit: 2017-10-01 | Discharge: 2017-10-01 | Disposition: A | Discharge status: Home / Self Care | Payer: Medicare Other | Source: Ambulatory Visit | Attending: Family Medicine | Admitting: Family Medicine

## 2017-10-01 ENCOUNTER — Other Ambulatory Visit: Payer: Self-pay | Admitting: Family Medicine

## 2017-10-01 DIAGNOSIS — R1011 Right upper quadrant pain: Secondary | ICD-10-CM | POA: Diagnosis not present

## 2017-10-01 DIAGNOSIS — K8020 Calculus of gallbladder without cholecystitis without obstruction: Secondary | ICD-10-CM | POA: Diagnosis not present

## 2017-10-01 NOTE — Progress Notes (Signed)
Refer to gen surg 

## 2017-10-02 ENCOUNTER — Telehealth: Payer: Self-pay | Admitting: Family Medicine

## 2017-10-02 NOTE — Telephone Encounter (Signed)
Copied from CRM [id number]#[id number]. Topic: Quick Communication - See Telephone Encounter >> Oct 02, 2017  5:33 PM Raquel SarnaHayes, Teresa G wrote: Pt cannot be seen by doctor in Dr. Aleen CampiPiscoya until Tues at 2:15 for her gallbladder surgery consultation. Pt needing a call back regarding gallbladder pain. Please call pt back because she is in severe pain.

## 2017-10-03 ENCOUNTER — Encounter: Payer: Self-pay | Admitting: Nurse Practitioner

## 2017-10-03 NOTE — Telephone Encounter (Signed)
I contacted this patient to strongly encourage her (as it was mention in the voicemail left on yesterday) to go to the nearest ER. She stated the pain has gotten worse and she did not know what else to do.  I consulted with Dr. Baruch GoutyMelinda Lada and she stated that this patient she go to the ER asap.  Patient stated that she was going to jump into the shower and head on over.

## 2017-10-05 ENCOUNTER — Ambulatory Visit
Admission: RE | Admit: 2017-10-05 | Discharge: 2017-10-05 | Disposition: A | Discharge status: Home / Self Care | Payer: Medicare Other | Source: Ambulatory Visit | Attending: Nurse Practitioner | Admitting: Nurse Practitioner

## 2017-10-05 ENCOUNTER — Inpatient Hospital Stay: Payer: Medicare Other | Attending: Nurse Practitioner | Admitting: Nurse Practitioner

## 2017-10-05 DIAGNOSIS — K8020 Calculus of gallbladder without cholecystitis without obstruction: Secondary | ICD-10-CM | POA: Diagnosis not present

## 2017-10-05 DIAGNOSIS — Z87891 Personal history of nicotine dependence: Secondary | ICD-10-CM | POA: Diagnosis not present

## 2017-10-05 DIAGNOSIS — Z122 Encounter for screening for malignant neoplasm of respiratory organs: Secondary | ICD-10-CM | POA: Diagnosis not present

## 2017-10-05 DIAGNOSIS — J439 Emphysema, unspecified: Secondary | ICD-10-CM | POA: Insufficient documentation

## 2017-10-05 NOTE — Progress Notes (Signed)
In accordance with CMS guidelines, patient has met eligibility criteria including age, absence of signs or symptoms of lung cancer.  Social History   Tobacco Use  . Smoking status: Current Every Day Smoker    Packs/day: 1.00    Years: 42.00    Pack years: 42.00    Types: Cigarettes  . Smokeless tobacco: Never Used  Substance Use Topics  . Alcohol use: No  . Drug use: No      A shared decision-making session was conducted prior to the performance of CT scan. This includes one or more decision aids, includes benefits and harms of screening, follow-up diagnostic testing, over-diagnosis, false positive rate, and total radiation exposure.   Counseling on the importance of adherence to annual lung cancer LDCT screening, impact of co-morbidities, and ability or willingness to undergo diagnosis and treatment is imperative for compliance of the program.   Counseling on the importance of continued smoking cessation for former smokers; the importance of smoking cessation for current smokers, and information about tobacco cessation interventions have been given to patient including [place] and [facility] quit [place] programs.   Written order for lung cancer screening with LDCT has been given to the patient and any and all questions have been answered to the best of my abilities.    Yearly follow up will be coordinated by Burgess Estelle, Thoracic Navigator.  Beckey Rutter, DNP, AGNP-C [place] [facility] [phone] (work cell) [phone] (office) 10/05/17 5:01 PM

## 2017-10-08 ENCOUNTER — Encounter: Payer: Self-pay | Admitting: *Deleted

## 2017-10-09 ENCOUNTER — Ambulatory Visit (INDEPENDENT_AMBULATORY_CARE_PROVIDER_SITE_OTHER): Payer: Medicare Other | Admitting: Surgery

## 2017-10-09 ENCOUNTER — Encounter: Payer: Self-pay | Admitting: Surgery

## 2017-10-09 VITALS — BP 115/77 | HR 71 | Temp 97.8°F | Ht 66.0 in | Wt 157.6 lb

## 2017-10-09 DIAGNOSIS — K8020 Calculus of gallbladder without cholecystitis without obstruction: Secondary | ICD-10-CM | POA: Diagnosis not present

## 2017-10-09 NOTE — Progress Notes (Signed)
10/09/2017  Reason for Visit:  Cholelithiasis  Referring Provider:  Baruch Gouty, MD  History of Present Illness: Ann Pena is a 57 y.o. female with a history of symptomatic cholelithiasis.  She reports that she's had this for a very long time, approximately three years.  She reports that the pain is in the right upper quadrant and she feels like a stabbing sensation when the pain occurs.  It is sometimes related to po intake, but sometimes it happens randomly.  The pain does subside with time, and she uses heating pads to do this.  She has had some episodes with associated nausea and vomiting, but the main symptom is pain.  She initially thought it was related to getting older, but then she had an outpatient workup with Dr. Sherie Don which showed cholelithiasis on ultrasound.  She has been having worsening pain lately, particularly since the ultrasound was done, and has been eating tomato salad sandwiches which contain mayo.  She feels the food is not helping her symptoms.  She is taking Aleve to help with the pain.  Past Medical History: Past Medical History:  Diagnosis Date  . Anxiety state, unspecified   . Anxiety state, unspecified   . Aortic calcification (HCC) 09/20/2017  . Asthma   . 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 Chest pain, unspecified   . Chronic kidney disease, stage III (moderate) (HCC)   . Depression   . GERD (gastroesophageal reflux disease) 09/16/2014  . H/O endoscopy 05/30/2012   Overview:  Dr. Kathie Rhodes. Iftikhar--LA Grade B reflux esophagitis. Hiatus hernia. Gastritis. Overview:  Dr. Kathie Rhodes. Iftikhar--[place] Grade B reflux esophagitis. Hiatus hernia. Gastritis.  . Hypertension 03/18/2012  . Infective otitis externa, unspecified   . Murmur 10/17/2013  . Obesity, unspecified   . Other and unspecified hyperlipidemia   . Other nonspecific abnormal serum enzyme levels   . Pain in joint, lower leg   . Pain in limb   . Panic disorder without agoraphobia   .  Posttraumatic stress disorder   . Prediabetes 09/20/2017  . Screening for malignant neoplasm of breast 04/04/2012  . Shortness of breath   . Syncope and collapse   . Undiagnosed cardiac murmurs      Past Surgical History: Past Surgical History:  Procedure Laterality Date  . SHOULDER SURGERY    . TOTAL ABDOMINAL HYSTERECTOMY      Home Medications: Prior to Admission medications   Medication Sig Start Date End Date Taking? Authorizing Provider  albuterol (PROVENTIL HFA;VENTOLIN HFA) 108 (90 Base) MCG/ACT inhaler Inhale 2 puffs into the lungs every 4 (four) hours as needed for wheezing or shortness of breath. 09/20/17  Yes Lada, Janit Bern, MD  ALPRAZolam (XANAX) 1 MG tablet Take 1.5 mg by mouth 4 (four) times daily.  04/01/15  Yes [provider]  citalopram (CELEXA) 40 MG tablet Take 1 tablet by mouth daily. 08/07/13  Yes [provider]  ezetimibe (ZETIA) 10 MG tablet Take 1 tablet (10 mg total) by mouth daily. 09/21/17  Yes Lada, Janit Bern, MD  mirtazapine (REMERON) 45 MG tablet Take 1 tablet by mouth at bedtime.   Yes [provider]  omeprazole (PRILOSEC) 40 MG capsule Take 1 capsule (40 mg total) by mouth daily. 06/01/17  Yes Lada, Janit Bern, MD    Allergies: Allergies  Allergen Reactions  . Penicillins   . Sulfa Antibiotics     Social History:  reports that she has been smoking cigarettes.  She has a 42.00 pack-year  smoking history. She has never used smokeless tobacco. She reports that she does not drink alcohol or use drugs.   Family History: Family History  Problem Relation Age of Onset  . Hypertension Mother   . COPD Mother   . Heart disease Mother   . Hypertension Father   . Bone cancer Father   . Heart attack Father   . Heart disease Father   . Thyroid disease Sister     Review of Systems: Review of Systems  Constitutional: Negative for chills and fever.  HENT: Negative for hearing loss.   Respiratory: Negative for shortness of breath.    Cardiovascular: Negative for chest pain.  Gastrointestinal: Positive for abdominal pain, heartburn, nausea and vomiting. Negative for blood in stool, constipation and diarrhea.  Genitourinary: Negative for dysuria.  Musculoskeletal: Negative for myalgias.  Neurological: Negative for dizziness.  Psychiatric/Behavioral: Negative for depression.  All other systems reviewed and are negative.   Physical Exam BP 115/77   Pulse 71   Temp 97.8 F (36.6 C) (Oral)   Ht 5\' 6"  (1.676 m)   Wt 157 lb 9.6 oz (71.5 kg)   BMI 25.44 kg/m  CONSTITUTIONAL: No acute distress HEENT:  Normocephalic, atraumatic, extraocular motion intact. NECK: Trachea is midline, and there is no jugular venous distension.  RESPIRATORY:  Lungs are clear, and breath sounds are equal bilaterally. Normal respiratory effort without pathologic use of accessory muscles. CARDIOVASCULAR: Heart is regular without murmurs, gallops, or rubs. GI: The abdomen is soft, non-distended, with tenderness to palpation in the right upper quadrant, with what looks to be a positive Murphy's sign.  MUSCULOSKELETAL:  Normal muscle strength and tone in all four extremities.  No peripheral edema or cyanosis. SKIN: Skin turgor is normal. There are no pathologic skin lesions.  NEUROLOGIC:  Motor and sensation is grossly normal.  Cranial nerves are grossly intact. PSYCH:  Alert and oriented to person, place and time. Affect is normal.  Laboratory Analysis: Labs from 09/20/2017 show a sodium of 139, potassium 4.2, chloride 102, CO2 30, BUN 6, creatinine 1.1, total bilirubin of 0.3, AST 15, ALT 6, alkaline phosphatase 115.  Imaging: Ultrasound on 7/1 shows cholelithiasis with a 1.5 cm gallstone and no wall thickening or pericholecystic fluid.  CT scan of the chest on 7/5 is able to see cholelithiasis as well without any biliary dilatation.  The patient does have a small hiatal hernia.  Assessment and Plan: This is a 57 y.o. female who presents with  symptomatic cholelithiasis.  I have independently reviewed the patient's imaging studies and reviewed the laboratory studies with the patient.  Discussed with the patient that she deftly has symptomatic cholelithiasis and has positive Murphy's sign on exam.  I did talk with the patient about sending her to the emergency room for admission but she did not want to go through that process.  She would rather be scheduled for surgery.  I would want to schedule her soon and will book her for early next week.  Discussed with the patient that we will attempt to do a laparoscopic cholecystectomy.  Discussed with the patient the risks of bleeding, infection, and injury to surrounding structures particularly the common bile duct as well as the need for converting to an open procedure.  She understands this risks and she is willing to proceed.  We will book her for 7/15.  Face-to-face time spent with the patient and care providers was 60 minutes, with more than 50% of the time spent counseling, educating, and  coordinating care of the patient.     Howie Ill, MD [facility]

## 2017-10-09 NOTE — H&P (View-Only) (Signed)
10/09/2017  Reason for Visit:  Cholelithiasis  Referring Provider:  Melinda Lada, MD  History of Present Illness: Ann Pena is a 57 y.o. female with a history of symptomatic cholelithiasis.  She reports that she's had this for a very long time, approximately three years.  She reports that the pain is in the right upper quadrant and she feels like a stabbing sensation when the pain occurs.  It is sometimes related to po intake, but sometimes it happens randomly.  The pain does subside with time, and she uses heating pads to do this.  She has had some episodes with associated nausea and vomiting, but the main symptom is pain.  She initially thought it was related to getting older, but then she had an outpatient workup with Dr. Lada which showed cholelithiasis on ultrasound.  She has been having worsening pain lately, particularly since the ultrasound was done, and has been eating tomato salad sandwiches which contain mayo.  She feels the food is not helping her symptoms.  She is taking Aleve to help with the pain.  Past Medical History: Past Medical History:  Diagnosis Date  . Anxiety state, unspecified   . Anxiety state, unspecified   . Aortic calcification (HCC) 09/20/2017  . Asthma   . 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 Chest pain, unspecified   . Chronic kidney disease, stage III (moderate) (HCC)   . Depression   . GERD (gastroesophageal reflux disease) 09/16/2014  . H/O endoscopy 05/30/2012   Overview:  Dr. S. Iftikhar--[place] Grade B reflux esophagitis. Hiatus hernia. Gastritis. Overview:  Dr. S. Iftikhar--[place] Grade B reflux esophagitis. Hiatus hernia. Gastritis.  . Hypertension 03/18/2012  . Infective otitis externa, unspecified   . Murmur 10/17/2013  . Obesity, unspecified   . Other and unspecified hyperlipidemia   . Other nonspecific abnormal serum enzyme levels   . Pain in joint, lower leg   . Pain in limb   . Panic disorder without agoraphobia   .  Posttraumatic stress disorder   . Prediabetes 09/20/2017  . Screening for malignant neoplasm of breast 04/04/2012  . Shortness of breath   . Syncope and collapse   . Undiagnosed cardiac murmurs      Past Surgical History: Past Surgical History:  Procedure Laterality Date  . SHOULDER SURGERY    . TOTAL ABDOMINAL HYSTERECTOMY      Home Medications: Prior to Admission medications   Medication Sig Start Date End Date Taking? Authorizing Provider  albuterol (PROVENTIL HFA;VENTOLIN HFA) 108 (90 Base) MCG/ACT inhaler Inhale 2 puffs into the lungs every 4 (four) hours as needed for wheezing or shortness of breath. 09/20/17  Yes Lada, Melinda P, MD  ALPRAZolam (XANAX) 1 MG tablet Take 1.5 mg by mouth 4 (four) times daily.  04/01/15  Yes [provider]  citalopram (CELEXA) 40 MG tablet Take 1 tablet by mouth daily. 08/07/13  Yes [provider]  ezetimibe (ZETIA) 10 MG tablet Take 1 tablet (10 mg total) by mouth daily. 09/21/17  Yes Lada, Melinda P, MD  mirtazapine (REMERON) 45 MG tablet Take 1 tablet by mouth at bedtime.   Yes [provider]  omeprazole (PRILOSEC) 40 MG capsule Take 1 capsule (40 mg total) by mouth daily. 06/01/17  Yes Lada, Melinda P, MD    Allergies: Allergies  Allergen Reactions  . Penicillins   . Sulfa Antibiotics     Social History:  reports that she has been smoking cigarettes.  She has a 42.00 pack-year   smoking history. She has never used smokeless tobacco. She reports that she does not drink alcohol or use drugs.   Family History: Family History  Problem Relation Age of Onset  . Hypertension Mother   . COPD Mother   . Heart disease Mother   . Hypertension Father   . Bone cancer Father   . Heart attack Father   . Heart disease Father   . Thyroid disease Sister     Review of Systems: Review of Systems  Constitutional: Negative for chills and fever.  HENT: Negative for hearing loss.   Respiratory: Negative for shortness of breath.    Cardiovascular: Negative for chest pain.  Gastrointestinal: Positive for abdominal pain, heartburn, nausea and vomiting. Negative for blood in stool, constipation and diarrhea.  Genitourinary: Negative for dysuria.  Musculoskeletal: Negative for myalgias.  Neurological: Negative for dizziness.  Psychiatric/Behavioral: Negative for depression.  All other systems reviewed and are negative.   Physical Exam BP 115/77   Pulse 71   Temp 97.8 F (36.6 C) (Oral)   Ht 5' 6" (1.676 m)   Wt 157 lb 9.6 oz (71.5 kg)   BMI 25.44 kg/m  CONSTITUTIONAL: No acute distress HEENT:  Normocephalic, atraumatic, extraocular motion intact. NECK: Trachea is midline, and there is no jugular venous distension.  RESPIRATORY:  Lungs are clear, and breath sounds are equal bilaterally. Normal respiratory effort without pathologic use of accessory muscles. CARDIOVASCULAR: Heart is regular without murmurs, gallops, or rubs. GI: The abdomen is soft, non-distended, with tenderness to palpation in the right upper quadrant, with what looks to be a positive Murphy's sign.  MUSCULOSKELETAL:  Normal muscle strength and tone in all four extremities.  No peripheral edema or cyanosis. SKIN: Skin turgor is normal. There are no pathologic skin lesions.  NEUROLOGIC:  Motor and sensation is grossly normal.  Cranial nerves are grossly intact. PSYCH:  Alert and oriented to person, place and time. Affect is normal.  Laboratory Analysis: Labs from 09/20/2017 show a sodium of 139, potassium 4.2, chloride 102, CO2 30, BUN 6, creatinine 1.1, total bilirubin of 0.3, AST 15, ALT 6, alkaline phosphatase 115.  Imaging: Ultrasound on 7/1 shows cholelithiasis with a 1.5 cm gallstone and no wall thickening or pericholecystic fluid.  CT scan of the chest on 7/5 is able to see cholelithiasis as well without any biliary dilatation.  The patient does have a small hiatal hernia.  Assessment and Plan: This is a 57 y.o. female who presents with  symptomatic cholelithiasis.  I have independently reviewed the patient's imaging studies and reviewed the laboratory studies with the patient.  Discussed with the patient that she deftly has symptomatic cholelithiasis and has positive Murphy's sign on exam.  I did talk with the patient about sending her to the emergency room for admission but she did not want to go through that process.  She would rather be scheduled for surgery.  I would want to schedule her soon and will book her for early next week.  Discussed with the patient that we will attempt to do a laparoscopic cholecystectomy.  Discussed with the patient the risks of bleeding, infection, and injury to surrounding structures particularly the common bile duct as well as the need for converting to an open procedure.  She understands this risks and she is willing to proceed.  We will book her for 7/15.  Face-to-face time spent with the patient and care providers was 60 minutes, with more than 50% of the time spent counseling, educating, and   coordinating care of the patient.     Raja Liska Luis Petrita Blunck, MD [facility]   

## 2017-10-09 NOTE — Patient Instructions (Addendum)
You have requested to have your gallbladder removed. This will be done on at [facility] with Dr. Aleen Campi. We will call you with the date and time.     Low-Fat Diet for Pancreatitis or Gallbladder Conditions A low-fat diet can be helpful if you have pancreatitis or a gallbladder condition. With these conditions, your pancreas and gallbladder have trouble digesting fats. A healthy eating plan with less fat will help rest your pancreas and gallbladder and reduce your symptoms. What do I need to know about this diet?  Eat a low-fat diet. ? Reduce your fat intake to less than 20-30% of your total daily calories. This is less than 50-60 g of fat per day. ? Remember that you need some fat in your diet. Ask your dietician what your daily goal should be. ? Choose nonfat and low-fat healthy foods. Look for the words "nonfat," "low fat," or "fat free." ? As a guide, look on the label and choose foods with less than 3 g of fat per serving. Eat only one serving.  Avoid alcohol.  Do not smoke. If you need help quitting, talk with your health care provider.  Eat small frequent meals instead of three large heavy meals. What foods can I eat? Grains Include healthy grains and starches such as potatoes, wheat bread, fiber-rich cereal, and brown rice. Choose whole grain options whenever possible. In adults, whole grains should account for 45-65% of your daily calories. Fruits and Vegetables Eat plenty of fruits and vegetables. Fresh fruits and vegetables add fiber to your diet. Meats and Other Protein Sources Eat lean meat such as chicken and pork. Trim any fat off of meat before cooking it. Eggs, fish, and beans are other sources of protein. In adults, these foods should account for 10-35% of your daily calories. Dairy Choose low-fat milk and dairy options. Dairy includes fat and protein, as well as calcium. Fats and Oils Limit high-fat foods such as fried foods, sweets, baked goods, sugary  drinks. Other Creamy sauces and condiments, such as mayonnaise, can add extra fat. Think about whether or not you need to use them, or use smaller amounts or low fat options. What foods are not recommended?  High fat foods, such as: ? [employer]. ? Ice cream. ? [place] toast. ? Sweet rolls. ? Pizza. ? Cheese bread. ? Foods covered with batter, butter, creamy sauces, or cheese. ? Fried foods. ? Sugary drinks and desserts.  Foods that cause gas or bloating This information is not intended to replace advice given to you by your health care provider. Make sure you discuss any questions you have with your health care provider. Document Released: 03/25/2013 Document Revised: 08/26/2015 Document Reviewed: 03/03/2013 Elsevier Interactive Patient Education  2017 [employer].   You will most likely be out of work 1-2 weeks for this surgery. You will return after your post-op appointment with a lifting restriction for approximately 4 more weeks.  You will be able to eat anything you would like to following surgery. But, start by eating a bland diet and advance this as tolerated. The Gallbladder diet is below, please go as closely by this diet as possible prior to surgery to avoid any further attacks.  Please see the (blue)pre-care form that you have been given today. If you have any questions, please call our office.  Laparoscopic Cholecystectomy Laparoscopic cholecystectomy is surgery to remove the gallbladder. The gallbladder is located in the upper right part of the abdomen, behind the liver. It is a Electrical engineer  sac for bile, which is produced in the liver. Bile aids in the digestion and absorption of fats. Cholecystectomy is often done for inflammation of the gallbladder (cholecystitis). This condition is usually caused by a buildup of gallstones (cholelithiasis) in the gallbladder. Gallstones can block the flow of bile, and that can result in inflammation and pain. In severe cases, emergency  surgery may be required. If emergency surgery is not required, you will have time to prepare for the procedure. Laparoscopic surgery is an alternative to open surgery. Laparoscopic surgery has a shorter recovery time. Your common bile duct may also need to be examined during the procedure. If stones are found in the common bile duct, they may be removed. LET [facility] CARE PROVIDER KNOW ABOUT:  Any allergies you have.  All medicines you are taking, including vitamins, herbs, eye drops, creams, and over-the-counter medicines.  Previous problems you or members of your family have had with the use of anesthetics.  Any blood disorders you have.  Previous surgeries you have had.    Any medical conditions you have. RISKS AND COMPLICATIONS Generally, this is a safe procedure. However, problems may occur, including:  Infection.  Bleeding.  Allergic reactions to medicines.  Damage to other structures or organs.  A stone remaining in the common bile duct.  A bile leak from the cyst duct that is clipped when your gallbladder is removed.  The need to convert to open surgery, which requires a larger incision in the abdomen. This may be necessary if your surgeon thinks that it is not safe to continue with a laparoscopic procedure. BEFORE THE PROCEDURE  Ask your health care provider about:  Changing or stopping your regular medicines. This is especially important if you are taking diabetes medicines or blood thinners.  Taking medicines such as aspirin and ibuprofen. These medicines can thin your blood. Do not take these medicines before your procedure if your health care provider instructs you not to.  Follow instructions from your health care provider about eating or drinking restrictions.  Let your health care provider know if you develop a cold or an infection before surgery.  Plan to have someone take you home after the procedure.  Ask your health care provider how your surgical  site will be marked or identified.  You may be given antibiotic medicine to help prevent infection. PROCEDURE  To reduce your risk of infection:  Your health care team will wash or sanitize their hands.  Your skin will be washed with soap.  An IV tube may be inserted into one of your veins.  You will be given a medicine to make you fall asleep (general anesthetic).  A breathing tube will be placed in your mouth.  The surgeon will make several small cuts (incisions) in your abdomen.  A thin, lighted tube (laparoscope) that has a tiny camera on the end will be inserted through one of the small incisions. The camera on the laparoscope will send a picture to a TV screen (monitor) in the operating room. This will give the surgeon a good view inside your abdomen.  A gas will be pumped into your abdomen. This will expand your abdomen to give the surgeon more room to perform the surgery.  Other tools that are needed for the procedure will be inserted through the other incisions. The gallbladder will be removed through one of the incisions.  After your gallbladder has been removed, the incisions will be closed with stitches (sutures), staples, or skin glue.  Your incisions may be covered with a bandage (dressing). The procedure may vary among health care providers and hospitals. AFTER THE PROCEDURE  Your blood pressure, heart rate, breathing rate, and blood oxygen level will be monitored often until the medicines you were given have worn off.  You will be given medicines as needed to control your pain.   This information is not intended to replace advice given to you by your health care provider. Make sure you discuss any questions you have with your health care provider.   Document Released: 03/20/2005 Document Revised: 12/09/2014 Document Reviewed: 10/30/2012 Elsevier Interactive Patient Education 2016 Elsevier Inc.   Low-Fat Diet for Gallbladder Conditions A low-fat diet can be  helpful if you have pancreatitis or a gallbladder condition. With these conditions, your pancreas and gallbladder have trouble digesting fats. A healthy eating plan with less fat will help rest your pancreas and gallbladder and reduce your symptoms. WHAT DO I NEED TO KNOW ABOUT THIS DIET?  Eat a low-fat diet.  Reduce your fat intake to less than 20-30% of your total daily calories. This is less than 50-60 g of fat per day.  Remember that you need some fat in your diet. Ask your dietician what your daily goal should be.  Choose nonfat and low-fat healthy foods. Look for the words "nonfat," "low fat," or "fat free."  As a guide, look on the label and choose foods with less than 3 g of fat per serving. Eat only one serving.  Avoid alcohol.  Do not smoke. If you need help quitting, talk with your health care provider.  Eat small frequent meals instead of three large heavy meals. WHAT FOODS CAN I EAT? Grains Include healthy grains and starches such as potatoes, wheat bread, fiber-rich cereal, and brown rice. Choose whole grain options whenever possible. In adults, whole grains should account for 45-65% of your daily calories.  Fruits and Vegetables Eat plenty of fruits and vegetables. Fresh fruits and vegetables add fiber to your diet. Meats and Other Protein Sources Eat lean meat such as chicken and pork. Trim any fat off of meat before cooking it. Eggs, fish, and beans are other sources of protein. In adults, these foods should account for 10-35% of your daily calories. Dairy Choose low-fat milk and dairy options. Dairy includes fat and protein, as well as calcium.  Fats and Oils Limit high-fat foods such as fried foods, sweets, baked goods, sugary drinks.  Other Creamy sauces and condiments, such as mayonnaise, can add extra fat. Think about whether or not you need to use them, or use smaller amounts or low fat options. WHAT FOODS ARE NOT RECOMMENDED?  High fat foods, such as:  [employer]  goods.  Ice cream.  [place] toast.  Sweet rolls.  Pizza.  Cheese bread.  Foods covered with batter, butter, creamy sauces, or cheese.  Fried foods.  Sugary drinks and desserts.  Foods that cause gas or bloating   This information is not intended to replace advice given to you by your health care provider. Make sure you discuss any questions you have with your health care provider.   Document Released: 03/25/2013 Document Reviewed: 03/25/2013 Elsevier Interactive Patient Education [employer] Elsevier Inc.

## 2017-10-12 ENCOUNTER — Telehealth: Payer: Self-pay | Admitting: Surgery

## 2017-10-12 NOTE — Telephone Encounter (Signed)
Pt advised of pre op date/time and sx date. Sx: 10/15/17 with Dr Piscoya-laparoscopic cholecystectomy.  Pre op: patient will arrive at 10:00am the day of surgery at pre admission testing.  Patient made aware to call [phone], between 1-3:00pm the day before surgery, to find out what time to arrive.

## 2017-10-15 ENCOUNTER — Encounter
Admission: RE | Disposition: A | Discharge status: Home / Self Care | Payer: Self-pay | Source: Ambulatory Visit | Attending: Surgery

## 2017-10-15 ENCOUNTER — Ambulatory Visit: Payer: Medicare Other | Admitting: Anesthesiology

## 2017-10-15 ENCOUNTER — Ambulatory Visit
Admission: RE | Admit: 2017-10-15 | Discharge: 2017-10-15 | Disposition: A | Discharge status: Home / Self Care | Payer: Medicare Other | Source: Ambulatory Visit | Attending: Surgery | Admitting: Surgery

## 2017-10-15 ENCOUNTER — Other Ambulatory Visit: Payer: Self-pay

## 2017-10-15 ENCOUNTER — Encounter: Payer: Self-pay | Admitting: *Deleted

## 2017-10-15 DIAGNOSIS — N183 Chronic kidney disease, stage 3 (moderate): Secondary | ICD-10-CM | POA: Insufficient documentation

## 2017-10-15 DIAGNOSIS — K219 Gastro-esophageal reflux disease without esophagitis: Secondary | ICD-10-CM | POA: Diagnosis not present

## 2017-10-15 DIAGNOSIS — I739 Peripheral vascular disease, unspecified: Secondary | ICD-10-CM | POA: Insufficient documentation

## 2017-10-15 DIAGNOSIS — J45909 Unspecified asthma, uncomplicated: Secondary | ICD-10-CM | POA: Insufficient documentation

## 2017-10-15 DIAGNOSIS — K8020 Calculus of gallbladder without cholecystitis without obstruction: Secondary | ICD-10-CM | POA: Diagnosis not present

## 2017-10-15 DIAGNOSIS — F17210 Nicotine dependence, cigarettes, uncomplicated: Secondary | ICD-10-CM | POA: Diagnosis not present

## 2017-10-15 DIAGNOSIS — I129 Hypertensive chronic kidney disease with stage 1 through stage 4 chronic kidney disease, or unspecified chronic kidney disease: Secondary | ICD-10-CM | POA: Insufficient documentation

## 2017-10-15 DIAGNOSIS — F329 Major depressive disorder, single episode, unspecified: Secondary | ICD-10-CM | POA: Diagnosis not present

## 2017-10-15 DIAGNOSIS — K8010 Calculus of gallbladder with chronic cholecystitis without obstruction: Secondary | ICD-10-CM | POA: Diagnosis not present

## 2017-10-15 DIAGNOSIS — E785 Hyperlipidemia, unspecified: Secondary | ICD-10-CM | POA: Diagnosis not present

## 2017-10-15 DIAGNOSIS — Z79899 Other long term (current) drug therapy: Secondary | ICD-10-CM | POA: Insufficient documentation

## 2017-10-15 HISTORY — PX: CHOLECYSTECTOMY: SHX55

## 2017-10-15 SURGERY — LAPAROSCOPIC CHOLECYSTECTOMY
Anesthesia: General | Wound class: Clean Contaminated

## 2017-10-15 MED ORDER — ROCURONIUM BROMIDE 100 MG/10ML IV SOLN
INTRAVENOUS | Status: DC | PRN
  Administered 2017-10-15: 10 mg via INTRAVENOUS
  Administered 2017-10-15: 40 mg via INTRAVENOUS

## 2017-10-15 MED ORDER — FENTANYL CITRATE (PF) 100 MCG/2ML IJ SOLN
INTRAMUSCULAR | Status: DC | PRN
  Administered 2017-10-15: 150 ug via INTRAVENOUS

## 2017-10-15 MED ORDER — CHLORHEXIDINE GLUCONATE CLOTH 2 % EX PADS: 6.0000 | MEDICATED_PAD | Freq: Once | CUTANEOUS | Status: DC

## 2017-10-15 MED ORDER — IPRATROPIUM-ALBUTEROL 0.5-2.5 (3) MG/3ML IN SOLN
RESPIRATORY_TRACT | Status: AC
  Filled 2017-10-15: qty 3

## 2017-10-15 MED ORDER — PROPOFOL 10 MG/ML IV BOLUS
INTRAVENOUS | Status: AC
  Filled 2017-10-15: qty 20

## 2017-10-15 MED ORDER — GABAPENTIN 300 MG PO CAPS
300.0000 mg | ORAL_CAPSULE | ORAL | Status: AC
  Administered 2017-10-15: 300 mg via ORAL

## 2017-10-15 MED ORDER — FAMOTIDINE 20 MG PO TABS
ORAL_TABLET | ORAL | Status: AC
  Administered 2017-10-15: 20 mg via ORAL
  Filled 2017-10-15: qty 1

## 2017-10-15 MED ORDER — MIDAZOLAM HCL 2 MG/2ML IJ SOLN
INTRAMUSCULAR | Status: DC | PRN
  Administered 2017-10-15: 2 mg via INTRAVENOUS

## 2017-10-15 MED ORDER — FENTANYL CITRATE (PF) 250 MCG/5ML IJ SOLN
INTRAMUSCULAR | Status: AC
Start: 2017-10-15 — End: ?
  Filled 2017-10-15: qty 5

## 2017-10-15 MED ORDER — ONDANSETRON HCL 4 MG/2ML IJ SOLN
INTRAMUSCULAR | Status: AC
  Filled 2017-10-15: qty 2

## 2017-10-15 MED ORDER — HYDROMORPHONE HCL 1 MG/ML IJ SOLN
0.2500 mg | INTRAMUSCULAR | Status: DC | PRN
  Administered 2017-10-15 (×2): 0.5 mg via INTRAVENOUS

## 2017-10-15 MED ORDER — ACETAMINOPHEN 500 MG PO TABS
ORAL_TABLET | ORAL | Status: AC
  Administered 2017-10-15: 1000 mg via ORAL
  Filled 2017-10-15: qty 2

## 2017-10-15 MED ORDER — EPHEDRINE SULFATE 50 MG/ML IJ SOLN
INTRAMUSCULAR | Status: DC | PRN
  Administered 2017-10-15: 10 mg via INTRAVENOUS

## 2017-10-15 MED ORDER — LIDOCAINE HCL (CARDIAC) PF 100 MG/5ML IV SOSY
PREFILLED_SYRINGE | INTRAVENOUS | Status: DC | PRN
  Administered 2017-10-15: 100 mg via INTRAVENOUS

## 2017-10-15 MED ORDER — KETOROLAC TROMETHAMINE 30 MG/ML IJ SOLN
INTRAMUSCULAR | Status: DC | PRN
  Administered 2017-10-15: 15 mg via INTRAVENOUS

## 2017-10-15 MED ORDER — FENTANYL CITRATE (PF) 100 MCG/2ML IJ SOLN
INTRAMUSCULAR | Status: AC
  Filled 2017-10-15: qty 2

## 2017-10-15 MED ORDER — METRONIDAZOLE IN NACL 5-0.79 MG/ML-% IV SOLN
500.0000 mg | INTRAVENOUS | Status: AC
  Administered 2017-10-15: 500 mg via INTRAVENOUS
  Filled 2017-10-15: qty 100

## 2017-10-15 MED ORDER — OXYCODONE HCL 5 MG PO TABS: 5.0000 mg | ORAL_TABLET | Freq: Four times a day (QID) | ORAL | 0 refills | Status: DC | PRN

## 2017-10-15 MED ORDER — FAMOTIDINE 20 MG PO TABS
20.0000 mg | ORAL_TABLET | Freq: Once | ORAL | Status: AC
  Administered 2017-10-15: 20 mg via ORAL

## 2017-10-15 MED ORDER — ROCURONIUM BROMIDE 50 MG/5ML IV SOLN
INTRAVENOUS | Status: AC
  Filled 2017-10-15: qty 1

## 2017-10-15 MED ORDER — HYDROCODONE-ACETAMINOPHEN 7.5-325 MG PO TABS
ORAL_TABLET | ORAL | Status: AC
  Administered 2017-10-15: 1 via ORAL
  Filled 2017-10-15: qty 1

## 2017-10-15 MED ORDER — HYDROMORPHONE HCL 1 MG/ML IJ SOLN
INTRAMUSCULAR | Status: DC | PRN
  Administered 2017-10-15: 0.5 mg via INTRAVENOUS

## 2017-10-15 MED ORDER — LACTATED RINGERS IV SOLN
INTRAVENOUS | Status: DC
  Administered 2017-10-15 (×2): via INTRAVENOUS

## 2017-10-15 MED ORDER — MIDAZOLAM HCL 2 MG/2ML IJ SOLN
INTRAMUSCULAR | Status: AC
  Filled 2017-10-15: qty 2

## 2017-10-15 MED ORDER — LIDOCAINE HCL (PF) 2 % IJ SOLN
INTRAMUSCULAR | Status: AC
  Filled 2017-10-15: qty 10

## 2017-10-15 MED ORDER — ACETAMINOPHEN 160 MG/5ML PO SOLN
325.0000 mg | ORAL | Status: DC | PRN
  Filled 2017-10-15: qty 20.3

## 2017-10-15 MED ORDER — SUGAMMADEX SODIUM 500 MG/5ML IV SOLN
INTRAVENOUS | Status: DC | PRN
  Administered 2017-10-15: 200 mg via INTRAVENOUS

## 2017-10-15 MED ORDER — MEPERIDINE HCL 50 MG/ML IJ SOLN: 6.2500 mg | INTRAMUSCULAR | Status: DC | PRN

## 2017-10-15 MED ORDER — PROMETHAZINE HCL 25 MG/ML IJ SOLN: 6.2500 mg | INTRAMUSCULAR | Status: DC | PRN

## 2017-10-15 MED ORDER — IBUPROFEN 600 MG PO TABS: 600.0000 mg | ORAL_TABLET | Freq: Three times a day (TID) | ORAL | 0 refills | Status: DC | PRN

## 2017-10-15 MED ORDER — KETOROLAC TROMETHAMINE 30 MG/ML IJ SOLN: 30.0000 mg | Freq: Once | INTRAMUSCULAR | Status: DC | PRN

## 2017-10-15 MED ORDER — CIPROFLOXACIN IN D5W 400 MG/200ML IV SOLN
INTRAVENOUS | Status: AC
  Filled 2017-10-15: qty 200

## 2017-10-15 MED ORDER — ACETAMINOPHEN 325 MG PO TABS: 325.0000 mg | ORAL_TABLET | ORAL | Status: DC | PRN

## 2017-10-15 MED ORDER — HYDROMORPHONE HCL 1 MG/ML IJ SOLN
INTRAMUSCULAR | Status: AC
  Filled 2017-10-15: qty 1

## 2017-10-15 MED ORDER — DEXAMETHASONE SODIUM PHOSPHATE 10 MG/ML IJ SOLN
INTRAMUSCULAR | Status: DC | PRN
  Administered 2017-10-15: 10 mg via INTRAVENOUS

## 2017-10-15 MED ORDER — HYDROCODONE-ACETAMINOPHEN 7.5-325 MG PO TABS
1.0000 | ORAL_TABLET | Freq: Once | ORAL | Status: AC | PRN
  Administered 2017-10-15: 1 via ORAL

## 2017-10-15 MED ORDER — BUPIVACAINE-EPINEPHRINE (PF) 0.25% -1:200000 IJ SOLN
INTRAMUSCULAR | Status: AC
  Filled 2017-10-15: qty 30

## 2017-10-15 MED ORDER — ONDANSETRON HCL 4 MG/2ML IJ SOLN
INTRAMUSCULAR | Status: DC | PRN
  Administered 2017-10-15: 4 mg via INTRAVENOUS

## 2017-10-15 MED ORDER — SUGAMMADEX SODIUM 200 MG/2ML IV SOLN
INTRAVENOUS | Status: AC
Start: 2017-10-15 — End: ?
  Filled 2017-10-15: qty 2

## 2017-10-15 MED ORDER — PROPOFOL 10 MG/ML IV BOLUS
INTRAVENOUS | Status: DC | PRN
  Administered 2017-10-15: 200 mg via INTRAVENOUS

## 2017-10-15 MED ORDER — CIPROFLOXACIN IN D5W 400 MG/200ML IV SOLN
400.0000 mg | INTRAVENOUS | Status: AC
  Administered 2017-10-15: 400 mg via INTRAVENOUS

## 2017-10-15 MED ORDER — ACETAMINOPHEN 500 MG PO TABS
1000.0000 mg | ORAL_TABLET | ORAL | Status: AC
  Administered 2017-10-15: 1000 mg via ORAL

## 2017-10-15 MED ORDER — DEXAMETHASONE SODIUM PHOSPHATE 10 MG/ML IJ SOLN
INTRAMUSCULAR | Status: AC
  Filled 2017-10-15: qty 1

## 2017-10-15 MED ORDER — GABAPENTIN 300 MG PO CAPS
ORAL_CAPSULE | ORAL | Status: AC
  Administered 2017-10-15: 300 mg via ORAL
  Filled 2017-10-15: qty 1

## 2017-10-15 MED ORDER — BUPIVACAINE-EPINEPHRINE (PF) 0.25% -1:200000 IJ SOLN
INTRAMUSCULAR | Status: DC | PRN
  Administered 2017-10-15: 30 mL

## 2017-10-15 MED ORDER — IPRATROPIUM-ALBUTEROL 0.5-2.5 (3) MG/3ML IN SOLN
3.0000 mL | Freq: Once | RESPIRATORY_TRACT | Status: AC
  Administered 2017-10-15: 3 mL via RESPIRATORY_TRACT

## 2017-10-15 SURGICAL SUPPLY — 42 items
APPLIER CLIP 5 13 M/L LIGAMAX5 (MISCELLANEOUS) ×3
BLADE SURG 15 STRL LF DISP TIS (BLADE) ×1 IMPLANT
BLADE SURG 15 STRL SS (BLADE) ×2
CANISTER SUCT 1200ML W/VALVE (MISCELLANEOUS) ×3 IMPLANT
CATH CHOLANGI 4FR 420404F (CATHETERS) IMPLANT
CHLORAPREP W/TINT 26ML (MISCELLANEOUS) ×3 IMPLANT
CLIP APPLIE 5 13 M/L LIGAMAX5 (MISCELLANEOUS) ×1 IMPLANT
CONRAY 60ML FOR OR (MISCELLANEOUS) ×3 IMPLANT
DERMABOND ADVANCED (GAUZE/BANDAGES/DRESSINGS) ×2
DERMABOND ADVANCED .7 DNX12 (GAUZE/BANDAGES/DRESSINGS) ×1 IMPLANT
DRAPE C-ARM XRAY 36X54 (DRAPES) IMPLANT
ELECT REM PT RETURN 9FT ADLT (ELECTROSURGICAL) ×3
ELECTRODE REM PT RTRN 9FT ADLT (ELECTROSURGICAL) ×1 IMPLANT
FILTER LAP SMOKE EVAC STRL (MISCELLANEOUS) ×3 IMPLANT
GLOVE SURG SYN 7.0 (GLOVE) ×3 IMPLANT
GLOVE SURG SYN 7.5  E (GLOVE) ×2
GLOVE SURG SYN 7.5 E (GLOVE) ×1 IMPLANT
GOWN STRL REUS W/ TWL LRG LVL3 (GOWN DISPOSABLE) ×3 IMPLANT
GOWN STRL REUS W/TWL LRG LVL3 (GOWN DISPOSABLE) ×6
IRRIGATION STRYKERFLOW (MISCELLANEOUS) IMPLANT
IRRIGATOR STRYKERFLOW (MISCELLANEOUS)
IV CATH ANGIO 12GX3 LT BLUE (NEEDLE) ×3 IMPLANT
IV NS 1000ML (IV SOLUTION)
IV NS 1000ML BAXH (IV SOLUTION) IMPLANT
JACKSON PRATT 10 (INSTRUMENTS) IMPLANT
L-HOOK LAP DISP 36CM (ELECTROSURGICAL) ×3
LABEL OR SOLS (LABEL) ×3 IMPLANT
LHOOK LAP DISP 36CM (ELECTROSURGICAL) ×1 IMPLANT
NEEDLE HYPO 22GX1.5 SAFETY (NEEDLE) ×6 IMPLANT
PACK LAP CHOLECYSTECTOMY (MISCELLANEOUS) ×3 IMPLANT
PENCIL ELECTRO HAND CTR (MISCELLANEOUS) ×3 IMPLANT
POUCH SPECIMEN RETRIEVAL 10MM (ENDOMECHANICALS) ×3 IMPLANT
SCISSORS METZENBAUM CVD 33 (INSTRUMENTS) ×3 IMPLANT
SLEEVE ADV FIXATION 5X100MM (TROCAR) ×9 IMPLANT
SPONGE VERSALON 4X4 4PLY (MISCELLANEOUS) IMPLANT
SUT MNCRL 4-0 (SUTURE) ×2
SUT MNCRL 4-0 27XMFL (SUTURE) ×1
SUT VICRYL 0 AB UR-6 (SUTURE) ×3 IMPLANT
SUTURE MNCRL 4-0 27XMF (SUTURE) ×1 IMPLANT
TROCAR BALLN GELPORT 12X130M (ENDOMECHANICALS) ×3 IMPLANT
TROCAR Z-THREAD OPTICAL 5X100M (TROCAR) ×3 IMPLANT
TUBING INSUFFLATION (TUBING) ×3 IMPLANT

## 2017-10-15 NOTE — Progress Notes (Signed)
O2 sat. Low on room air 88% , Dr. Providence LaniusHowell notified , duoneb ordered.

## 2017-10-15 NOTE — Interval H&P Note (Signed)
History and Physical Interval Note:  10/15/2017 11:53 AM  Ann Pena  has presented today for surgery, with the diagnosis of symptomatic cholelithiasis  The various methods of treatment have been discussed with the patient and family. After consideration of risks, benefits and other options for treatment, the patient has consented to  Procedure(s): LAPAROSCOPIC CHOLECYSTECTOMY (N/A) as a surgical intervention .  The patient's history has been reviewed, patient examined, no change in status, stable for surgery.  I have reviewed the patient's chart and labs.  Questions were answered to the patient's satisfaction.     Thaila Bottoms

## 2017-10-15 NOTE — Anesthesia Procedure Notes (Addendum)
Procedure Name: Intubation Performed by: Justus Memory, CRNA Pre-anesthesia Checklist: Patient identified, Patient being monitored, Timeout performed, Emergency Drugs available and Suction available Patient Re-evaluated:Patient Re-evaluated prior to induction Oxygen Delivery Method: Circle system utilized Preoxygenation: Pre-oxygenation with 100% oxygen Induction Type: IV induction Ventilation: Mask ventilation without difficulty Laryngoscope Size: Mac and 3 Grade View: Grade I Tube type: Oral Tube size: 7.0 mm Number of attempts: 1 Airway Equipment and Method: Stylet Placement Confirmation: ETT inserted through vocal cords under direct vision,  positive ETCO2 and breath sounds checked- equal and bilateral Secured at: 19 (teeth) cm Tube secured with: Tape Dental Injury: Teeth and Oropharynx as per pre-operative assessment

## 2017-10-15 NOTE — Anesthesia Preprocedure Evaluation (Addendum)
Anesthesia Evaluation  Patient identified by MRN, date of birth, ID band Patient awake    Reviewed: Allergy & Precautions, H&P , NPO status , reviewed documented beta blocker date and time   Airway Mallampati: II  TM Distance: >3 FB Neck ROM: full    Dental  (+) Upper Dentures, Partial Lower   Pulmonary shortness of breath, asthma , Current Smoker,    Pulmonary exam normal        Cardiovascular hypertension, + Peripheral Vascular Disease  Normal cardiovascular exam+ Valvular Problems/Murmurs   2015 ECHO Study Conclusions  - Left ventricle: The cavity size was normal. Wall thickness was normal. Systolic function was normal. The estimated ejection fraction was in the range of 60% to 65%. Wall motion was normal; there were no regional wall motion abnormalities. Left ventricular diastolic function parameters were normal. - Aortic valve: Sclerosis without stenosis. - Mitral valve: There was trivial regurgitation.  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 Kathie RhodesS. Iftikhar--[place] Grade B reflux               esophagitis. Hiatus hernia. Gastritis. Overview:  Dr. Kathie RhodesS.               Iftikhar--[place] Grade B reflux esophagitis. Hiatus hernia.   Gastritis. 03/18/2012: Hypertension No date: Infective otitis externa, unspecified 10/17/2013: Murmur No date: Obesity, unspecified No date: Other and unspecified hyperlipidemia No date: Other nonspecific abnormal serum enzyme levels No date: Pain in joint, lower leg No date: Pain in limb No date: Panic disorder without agoraphobia No date: Posttraumatic stress disorder 09/20/2017: Prediabetes 04/04/2012: Screening for malignant neoplasm of breast No date: Shortness of breath No date: Syncope and collapse No date: Undiagnosed cardiac murmurs Past Surgical History: No date: SHOULDER SURGERY No date: TOTAL ABDOMINAL HYSTERECTOMY BMI    Body Mass Index:  24.86 kg/m     Reproductive/Obstetrics                            Anesthesia Physical Anesthesia Plan  ASA: III  Anesthesia Plan: General ETT   Post-op Pain Management:    Induction: Intravenous  PONV Risk Score and Plan: 3 and Ondansetron, Treatment may vary due to age or medical condition, Midazolam, Dexamethasone and Metaclopromide  Airway Management Planned:   Additional Equipment:   Intra-op Plan:   Post-operative Plan: Extubation in OR  Informed Consent: I have reviewed the patients History and Physical, chart, labs and discussed the procedure including the risks, benefits and alternatives for the proposed anesthesia with the patient or authorized representative who has indicated his/her understanding and acceptance.   Dental Advisory Given  Plan Discussed with: CRNA  Anesthesia Plan Comments:        Anesthesia Quick Evaluation

## 2017-10-15 NOTE — Discharge Instructions (Signed)

## 2017-10-15 NOTE — Transfer of Care (Signed)
Immediate Anesthesia Transfer of Care Note  Patient: Ann Pena  Procedure(s) Performed: LAPAROSCOPIC CHOLECYSTECTOMY (N/A )  Patient Location: PACU  Anesthesia Type:General  Level of Consciousness: sedated  Airway & Oxygen Therapy: Patient Spontanous Breathing and Patient connected to face mask oxygen  Post-op Assessment: Report given to RN and Post -op Vital signs reviewed and stable  Post vital signs: Reviewed and stable  Last Vitals:  Vitals Value Taken Time  BP 133/65 10/15/2017  2:21 PM  Temp    Pulse 76 10/15/2017  2:22 PM  Resp 14 10/15/2017  2:22 PM  SpO2 94 % 10/15/2017  2:22 PM  Vitals shown include unvalidated device data.  Last Pain:  Vitals:   10/15/17 1024  TempSrc: Oral  PainSc: 5          Complications: No apparent anesthesia complications

## 2017-10-15 NOTE — Op Note (Signed)
  Procedure Date:  10/15/2017  Pre-operative Diagnosis:  Symptomatic cholelithiasis  Post-operative Diagnosis:  Symptomatic cholelithiasis  Procedure:  Laparoscopic cholecystectomy  Surgeon:  Howie IllJose Luis Aradhana Gin, MD  Anesthesia:  General endotracheal  Estimated Blood Loss:  10 ml  Specimens:  gallbladder  Complications:  None  Indications for Procedure:  This is a 57 y.o. female who presents with abdominal pain and workup revealing symptomatic cholelithiasis.  The benefits, complications, treatment options, and expected outcomes were discussed with the patient. The risks of bleeding, infection, recurrence of symptoms, failure to resolve symptoms, bile duct damage, bile duct leak, retained common bile duct stone, bowel injury, and need for further procedures were all discussed with the patient and she was willing to proceed.  Description of Procedure: The patient was correctly identified in the preoperative area and brought into the operating room.  The patient was placed supine with VTE prophylaxis in place.  Appropriate time-outs were performed.  Anesthesia was induced and the patient was intubated.  Appropriate antibiotics were infused.  The abdomen was prepped and draped in a sterile fashion. An infraumbilical incision was made. A cutdown technique was used to enter the abdominal cavity without injury, and a Hasson trocar was inserted.  Pneumoperitoneum was obtained with appropriate opening pressures.  A 5-mm port was placed in the subxiphoid area and two 5-mm ports were placed in the right upper quadrant under direct visualization.  The gallbladder was identified.  The fundus was grasped and retracted cephalad.  Adhesions were lysed bluntly and with electrocautery. The infundibulum was grasped and retracted laterally, exposing the peritoneum overlying the gallbladder.  This was incised with electrocautery and extended on either side of the gallbladder.  The cystic duct and cystic artery  were clearly identified and bluntly dissected, revealing critical view of safety.  Both were clipped twice proximally and once distally, cutting in between.  The gallbladder was taken from the gallbladder fossa in a retrograde fashion with electrocautery. The gallbladder was placed in an Endocatch bag and brought out via the umbilical incision. The liver bed was inspected and any bleeding was controlled with electrocautery. The right upper quadrant was then inspected again revealing intact clips, no bleeding, and no ductal injury.    The 5 mm ports were removed under direct visualization and the Hasson trocar was removed.  The fascial opening was closed using 0 vicryl suture.  Local anesthetic was infused in all incisions and the incisions were closed with 4-0 Monocryl.  The wounds were cleaned and sealed with DermaBond.  The patient was emerged from anesthesia and extubated and brought to the recovery room for further management.  The patient tolerated the procedure well and all counts were correct at the end of the case.   Howie IllJose Luis Breyden Jeudy, MD

## 2017-10-15 NOTE — Anesthesia Post-op Follow-up Note (Signed)
Anesthesia QCDR form completed.        

## 2017-10-16 ENCOUNTER — Encounter: Payer: Self-pay | Admitting: Surgery

## 2017-10-17 LAB — SURGICAL PATHOLOGY

## 2017-10-17 NOTE — Anesthesia Postprocedure Evaluation (Signed)
Anesthesia Post Note  Patient: [name]  Procedure(s) Performed: LAPAROSCOPIC CHOLECYSTECTOMY (N/A )  Patient location during evaluation: PACU Anesthesia Type: General Level of consciousness: awake and alert Pain management: pain level controlled Vital Signs Assessment: post-procedure vital signs reviewed and stable Respiratory status: spontaneous breathing, nonlabored ventilation, respiratory function stable and patient connected to nasal cannula oxygen Cardiovascular status: blood pressure returned to baseline and stable Postop Assessment: no apparent nausea or vomiting Anesthetic complications: no     Last Vitals:  Vitals:   10/15/17 1601 10/15/17 1622  BP: (!) 128/57 132/67  Pulse: 75   Resp: 16   Temp: 36.4 C   SpO2: 94% 98%    Last Pain:  Vitals:   10/15/17 1601  TempSrc:   PainSc: 7                  Muaad Boehning Garry Heater Giannina Bartolome

## 2017-10-29 ENCOUNTER — Encounter: Payer: Medicare Other | Admitting: Surgery

## 2017-10-30 ENCOUNTER — Ambulatory Visit (INDEPENDENT_AMBULATORY_CARE_PROVIDER_SITE_OTHER): Payer: Medicare Other | Admitting: Physician Assistant

## 2017-10-30 ENCOUNTER — Encounter: Payer: Self-pay | Admitting: Physician Assistant

## 2017-10-30 VITALS — BP 110/60 | HR 68 | Ht 65.5 in | Wt 154.0 lb

## 2017-10-30 DIAGNOSIS — R0989 Other specified symptoms and signs involving the circulatory and respiratory systems: Secondary | ICD-10-CM

## 2017-10-30 DIAGNOSIS — R0602 Shortness of breath: Secondary | ICD-10-CM | POA: Diagnosis not present

## 2017-10-30 DIAGNOSIS — R0600 Dyspnea, unspecified: Secondary | ICD-10-CM | POA: Diagnosis not present

## 2017-10-30 DIAGNOSIS — R079 Chest pain, unspecified: Secondary | ICD-10-CM | POA: Diagnosis not present

## 2017-10-30 DIAGNOSIS — Z720 Tobacco use: Secondary | ICD-10-CM | POA: Diagnosis not present

## 2017-10-30 DIAGNOSIS — E782 Mixed hyperlipidemia: Secondary | ICD-10-CM | POA: Diagnosis not present

## 2017-10-30 NOTE — Patient Instructions (Addendum)
Medication Instructions:  Your physician has recommended you make the following change in your medication:  1- START Crestor 10 mg by mouth once a day as prescribed by Dr Sherie Don.   Labwork: none  Testing/Procedures: Your physician has requested that you have an echocardiogram. Echocardiography is a painless test that uses sound waves to create images of your heart. It provides your doctor with information about the size and shape of your heart and how well your heart's chambers and valves are working. This procedure takes approximately one hour. There are no restrictions for this procedure. You may get an IV, if needed, to receive an ultrasound enhancing agent through to better visualize your heart.    Your physician has requested that you have a carotid duplex. This test is an ultrasound of the carotid arteries in your neck. It looks at blood flow through these arteries that supply the brain with blood. Allow one hour for this exam. There are no restrictions or special instructions.   Your physician has requested that you have a lexiscan myoview. For further information please visit [email or web address] Please follow instruction sheet, as given.  [facility] MYOVIEW  Your caregiver has ordered a Stress Test with nuclear imaging. The purpose of this test is to evaluate the blood supply to your heart muscle. This procedure is referred to as a "Non-Invasive Stress Test." This is because other than having an IV started in your vein, nothing is inserted or "invades" your body. Cardiac stress tests are done to find areas of poor blood flow to the heart by determining the extent of coronary artery disease (CAD). Some patients exercise on a treadmill, which naturally increases the blood flow to your heart, while others who are  unable to walk on a treadmill due to physical limitations have a pharmacologic/chemical stress agent called Lexiscan . This medicine will mimic walking on a treadmill by temporarily  increasing your coronary blood flow.   Please note: these test may take anywhere between 2-4 hours to complete  PLEASE REPORT TO [facility]  THE VOLUNTEERS AT THE FIRST DESK WILL DIRECT YOU WHERE TO GO  Date of Procedure:_____________________________________  Arrival Time for Procedure:______________________________    PLEASE NOTIFY THE OFFICE AT LEAST 24 HOURS IN ADVANCE IF YOU ARE UNABLE TO KEEP YOUR APPOINTMENT.  [phone] AND  PLEASE NOTIFY NUCLEAR MEDICINE AT [facility] AT LEAST 24 HOURS IN ADVANCE IF YOU ARE UNABLE TO KEEP YOUR APPOINTMENT. [phone]  How to prepare for your Myoview test:  1. Do not eat or drink after midnight 2. No caffeine for 24 hours prior to test 3. No smoking 24 hours prior to test. 4. Your medication may be taken with water.  If your doctor stopped a medication because of this test, do not take that medication. 5. Ladies, please do not wear dresses.  Skirts or pants are appropriate. Please wear a short sleeve shirt. 6. No perfume, cologne or lotion. 7. Wear comfortable walking shoes. No heels!   Follow-Up: Your physician recommends that you schedule a follow-up appointment in: 1-2 MONTHS WITH DR Kirke Corin OR RYAN ONCE TESTING IS DONE.  If you need a refill on your cardiac medications before your next appointment, please call your pharmacy.   Echocardiogram An echocardiogram, or echocardiography, uses sound waves (ultrasound) to produce an image of your heart. The echocardiogram is simple, painless, obtained within a short period of time, and offers valuable information to your health care provider. The images from an echocardiogram can provide information such as:  Evidence of coronary artery disease (CAD).  Heart size.  Heart muscle function.  Heart valve function.  Aneurysm detection.  Evidence of a past heart attack.  Fluid buildup around the heart.  Heart muscle thickening.  Assess heart valve function.  Tell a  health care provider about:  Any allergies you have.  All medicines you are taking, including vitamins, herbs, eye drops, creams, and over-the-counter medicines.  Any problems you or family members have had with anesthetic medicines.  Any blood disorders you have.  Any surgeries you have had.  Any medical conditions you have.  Whether you are pregnant or may be pregnant. What happens before the procedure? No special preparation is needed. Eat and drink normally. What happens during the procedure?  In order to produce an image of your heart, gel will be applied to your chest and a wand-like tool (transducer) will be moved over your chest. The gel will help transmit the sound waves from the transducer. The sound waves will harmlessly bounce off your heart to allow the heart images to be captured in real-time motion. These images will then be recorded.  You may need an IV to receive a medicine that improves the quality of the pictures. What happens after the procedure? You may return to your normal schedule including diet, activities, and medicines, unless your health care provider tells you otherwise. This information is not intended to replace advice given to you by your health care provider. Make sure you discuss any questions you have with your health care provider. Document Released: 03/17/2000 Document Revised: 11/06/2015 Document Reviewed: 11/25/2012 Elsevier Interactive Patient Education  2017 [employer]    Cardiac Nuclear Scan A cardiac nuclear scan is a test that measures blood flow to the heart when a person is resting and when he or she is exercising. The test looks for problems such as:  Not enough blood reaching a portion of the heart.  The heart muscle not working normally.  You may need this test if:  You have heart disease.  You have had abnormal lab results.  You have had heart surgery or angioplasty.  You have chest pain.  You have shortness of  breath.  In this test, a radioactive dye (tracer) is injected into your bloodstream. After the tracer has traveled to your heart, an imaging device is used to measure how much of the tracer is absorbed by or distributed to various areas of your heart. This procedure is usually done at a hospital and takes 2-4 hours. Tell a health care provider about:  Any allergies you have.  All medicines you are taking, including vitamins, herbs, eye drops, creams, and over-the-counter medicines.  Any problems you or family members have had with the use of anesthetic medicines.  Any blood disorders you have.  Any surgeries you have had.  Any medical conditions you have.  Whether you are pregnant or may be pregnant. What are the risks? Generally, this is a safe procedure. However, problems may occur, including:  Serious chest pain and heart attack. This is only a risk if the stress portion of the test is done.  Rapid heartbeat.  Sensation of warmth in your chest. This usually passes quickly.  What happens before the procedure?  Ask your health care provider about changing or stopping your regular medicines. This is especially important if you are taking diabetes medicines or blood thinners.  Remove your jewelry on the day of the procedure. What happens during the procedure?  An IV  tube will be inserted into one of your veins.  Your health care provider will inject a small amount of radioactive tracer through the tube.  You will wait for 20-40 minutes while the tracer travels through your bloodstream.  Your heart activity will be monitored with an electrocardiogram (ECG).  You will lie down on an exam table.  Images of your heart will be taken for about 15-20 minutes.  You may be asked to exercise on a treadmill or stationary bike. While you exercise, your heart's activity will be monitored with an ECG, and your blood pressure will be checked. If you are unable to exercise, you may be  given a medicine to increase blood flow to parts of your heart.  When blood flow to your heart has peaked, a tracer will again be injected through the IV tube.  After 20-40 minutes, you will get back on the exam table and have more images taken of your heart.  When the procedure is over, your IV tube will be removed. The procedure may vary among health care providers and hospitals. Depending on the type of tracer used, scans may need to be repeated 3-4 hours later. What happens after the procedure?  Unless your health care provider tells you otherwise, you may return to your normal schedule, including diet, activities, and medicines.  Unless your health care provider tells you otherwise, you may increase your fluid intake. This will help flush the contrast dye from your body. Drink enough fluid to keep your urine clear or pale yellow.  It is up to you to get your test results. Ask your health care provider, or the department that is doing the test, when your results will be ready. Summary  A cardiac nuclear scan measures the blood flow to the heart when a person is resting and when he or she is exercising.  You may need this test if you are at risk for heart disease.  Tell your health care provider if you are pregnant.  Unless your health care provider tells you otherwise, increase your fluid intake. This will help flush the contrast dye from your body. Drink enough fluid to keep your urine clear or pale yellow. This information is not intended to replace advice given to you by your health care provider. Make sure you discuss any questions you have with your health care provider. Document Released: 04/14/2004 Document Revised: 03/22/2016 Document Reviewed: 02/26/2013 Elsevier Interactive Patient Education  2017 [employer]

## 2017-10-30 NOTE — Progress Notes (Signed)
Cardiology Office Note Date:  10/30/2017  Patient ID:  Ann, Pena 1960/10/31, MRN [id number] PCP:  Kerman Passey, MD  Cardiologist:  Dr. Kirke Corin, MD    Chief Complaint: chest pain/SOB  History of Present Illness: Ann Pena is a 57 y.o. female with history of atypical chest pain, aortic valve sclerosis, severe anxiety and PTSD, severe hyperlipidemia with intolerance to statins, and ongoing tobacco abuse who presents for evaluation of chest pain and SOB.  Patient was last seen in the office on 09/06/2015 for evaluation of chest pain.  Patient has previously been evaluated by Dr. Kirke Corin in 2015 for chest pain.  She has dealt with severe anxiety and PTSD since her son was murdered in 2011.  She underwent nuclear stress testing in 2015 that showed no evidence of ischemia with a normal EF.  Echo showed normal LV systolic function with aortic sclerosis without any significant aortic valve stenosis.  When she was seen in 2017 she reported intermittent and rare episodes of sharp chest pain that would last for a few seconds and could happen with rest or with physical activities.  Unfortunately, she was smoking 1-1/2 packs/day.  Her symptoms were felt to be atypical and not cardiac in etiology.  No further ischemic work-up was needed at that time.  Risk factor modification was discussed.  Patient comes in today with several items to discuss. She request that we go over her prior cardiac testing from 2015. She also indicates she "lived on [place] and cigarettes for 3 years after my son was murdered." She indicates she would like to focus on her health at this point. Since her son was killed, she has not cared about her health. She has also been through the loss of multiple friends and family members over the years. Support system currently consists of her mother, sister, and her pet dog. She has noted an increase in chest tightness, SOB, and diaphoresis over the past 1-2 years. These  symptoms can occur with exertion or at rest. She notes sometimes after showering she is profusely "sweaty." She feels like some of this is related to her underlying PTSD. She continues to smoke, though is down from 2 packs daily to 0.5-1 pack daily She has smoked since age 32. She is scheduled for a smoking cessation class through the cancer center and is excited about this. She is now trying to eat a healthier diet. While at the beach in 08/2017, she was eating out at restaurants and foods higher in salt and noticed some bilateral lower extremity swelling that his since resolved. Labs recently checked by her PCP from 09/2017 showed a TC of 308, LDL 233, HDL 42, TG 161. Prior LDL of 228 from 10 months ago. A1c 5.2. SCr 1.10, LFT normal, K+ 4.4. She was advised to start Zetia 10 mg daily and Crestor 10 mg daily. Patient has not yet started the Crestor she indicates she has previously not tolerated it secondary to myalgias. She also indicates she could not tolerate Lipitor or simvastatin secondary to myalgias. She is open to trying Crestor one-more time. Currently, chest pain free.    Past Medical History:  Diagnosis Date  . Anxiety state, unspecified   . Aortic calcification (HCC) 09/20/2017  . Asthma   . 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 Chest pain, unspecified   . Chronic kidney disease, stage III (moderate) (HCC)   . Depression   . GERD (gastroesophageal reflux disease) 09/16/2014  .  H/O endoscopy 05/30/2012   Overview:  Dr. Kathie RhodesS. Iftikhar--LA Grade B reflux esophagitis. Hiatus hernia. Gastritis. Overview:  Dr. Kathie RhodesS. Iftikhar--LA Grade B reflux esophagitis. Hiatus hernia. Gastritis.  . Hypertension 03/18/2012  . Infective otitis externa, unspecified   . Murmur 10/17/2013  . Obesity, unspecified   . Other and unspecified hyperlipidemia   . Other nonspecific abnormal serum enzyme levels   . Pain in joint, lower leg   . Pain in limb   . Panic disorder without agoraphobia   .  Posttraumatic stress disorder   . Prediabetes 09/20/2017  . Screening for malignant neoplasm of breast 04/04/2012  . Shortness of breath   . Syncope and collapse     Past Surgical History:  Procedure Laterality Date  . CHOLECYSTECTOMY N/A 10/15/2017   Procedure: LAPAROSCOPIC CHOLECYSTECTOMY;  Surgeon: Henrene DodgePiscoya, Jose, MD;  Location: [facility] ORS;  Service: General;  Laterality: N/A;  . SHOULDER SURGERY    . TOTAL ABDOMINAL HYSTERECTOMY      Current Meds  Medication Sig  . albuterol (PROVENTIL HFA;VENTOLIN HFA) 108 (90 Base) MCG/ACT inhaler Inhale 2 puffs into the lungs every 4 (four) hours as needed for wheezing or shortness of breath.  . ALPRAZolam (XANAX) 1 MG tablet Take 1.5 mg by mouth 4 (four) times daily.   . citalopram (CELEXA) 40 MG tablet Take 40 mg by mouth daily.   Marland Kitchen. ezetimibe (ZETIA) 10 MG tablet Take 1 tablet (10 mg total) by mouth daily.  Marland Kitchen. ibuprofen (ADVIL,MOTRIN) 600 MG tablet Take 1 tablet (600 mg total) by mouth every 8 (eight) hours as needed.  . mirtazapine (REMERON) 45 MG tablet Take 45 mg by mouth at bedtime.   Marland Kitchen. omeprazole (PRILOSEC) 40 MG capsule Take 1 capsule (40 mg total) by mouth daily.  Marland Kitchen. oxyCODONE (OXY IR/ROXICODONE) 5 MG immediate release tablet Take 1 tablet (5 mg total) by mouth every 6 (six) hours as needed for severe pain or breakthrough pain.    Allergies:   Yellow jacket venom [bee venom]; Penicillins; and Sulfa antibiotics   Social History:  The patient  reports that she has been smoking cigarettes.  She has a 42.00 pack-year smoking history. She has never used smokeless tobacco. She reports that she does not drink alcohol or use drugs.   Family History:  The patient's family history includes Bone cancer in her father; COPD in her mother; Heart attack in her father; Heart disease in her father and mother; Hypertension in her father and mother; Thyroid disease in her sister.  ROS:   Review of Systems  Constitutional: Positive for malaise/fatigue. Negative  for chills, diaphoresis, fever and weight loss.  HENT: Negative for congestion.   Eyes: Negative for discharge and redness.  Respiratory: Positive for shortness of breath. Negative for cough, hemoptysis, sputum production and wheezing.   Cardiovascular: Positive for chest pain and leg swelling. Negative for palpitations, orthopnea, claudication and PND.  Gastrointestinal: Negative for abdominal pain, blood in stool, heartburn, melena, nausea and vomiting.  Genitourinary: Negative for hematuria.  Musculoskeletal: Negative for falls and myalgias.  Skin: Negative for rash.  Neurological: Positive for weakness. Negative for dizziness, tingling, tremors, sensory change, speech change, focal weakness and loss of consciousness.  Endo/Heme/Allergies: Does not bruise/bleed easily.  Psychiatric/Behavioral: Positive for depression. Negative for substance abuse and suicidal ideas. The patient is nervous/anxious.   All other systems reviewed and are negative.    PHYSICAL EXAM:  VS:  BP 110/60 (BP Location: Left Arm, Patient Position: Sitting, Cuff Size: Normal)   Pulse  68   Ht 5' 5.5" (1.664 m)   Wt 154 lb (69.9 kg)   BMI 25.24 kg/m  BMI: Body mass index is 25.24 kg/m.  Physical Exam  Constitutional: She is oriented to person, place, and time. She appears well-developed and well-nourished.  HENT:  Head: Normocephalic and atraumatic.  Eyes: Right eye exhibits no discharge. Left eye exhibits no discharge.  Neck: Normal range of motion. No JVD present.  Cardiovascular: Normal rate, regular rhythm, S1 normal and S2 normal. Exam reveals no distant heart sounds, no friction rub, no midsystolic click and no opening snap.  Murmur heard. High-pitched blowing holosystolic murmur is present with a grade of 1/6 at the apex. Pulses:      Carotid pulses are 2+ on the right side, and 2+ on the left side with bruit.      Dorsalis pedis pulses are 2+ on the right side, and 2+ on the left side.       Posterior  tibial pulses are 2+ on the right side, and 2+ on the left side.  Pulmonary/Chest: Effort normal and breath sounds normal. No respiratory distress. She has no decreased breath sounds. She has no wheezes. She has no rales. She exhibits no tenderness.  Abdominal: Soft. She exhibits no distension. There is no tenderness.  Musculoskeletal: She exhibits no edema.  Neurological: She is alert and oriented to person, place, and time.  Skin: Skin is warm and dry. No cyanosis. Nails show no clubbing.  Psychiatric: She has a normal mood and affect. Her speech is normal and behavior is normal. Judgment and thought content normal.     EKG:  Was ordered and interpreted by me today. Shows NSR, 68 bpm, possible left atrial enlargement, TWI III  Recent Labs: 09/20/2017: ALT 6; BUN 6; Creat 1.10; Potassium 4.4; Sodium 139  09/20/2017: Cholesterol 308; HDL 42; LDL Cholesterol (Calc) 233; Total CHOL/HDL Ratio 7.3; Triglycerides 161   CrCl cannot be calculated (Patient's most recent lab result is older than the maximum 21 days allowed.).   Wt Readings from Last 3 Encounters:  10/30/17 154 lb (69.9 kg)  10/15/17 154 lb (69.9 kg)  10/09/17 157 lb 9.6 oz (71.5 kg)     Other studies reviewed: Additional studies/records reviewed today include: summarized above  ASSESSMENT AND PLAN:  1. Chest pain with moderate risk for cardiac etiology: Currently chest pain free. Schedule Lexiscan Myoview to evaluate for high-risk ischemia. Previously unable to ambulate to target heart rate on a treadmill secondary to fatigue and SOB. If symptoms persist and stress test is unrevealing, consider cardiac CT with FFR.  2. SOB: Likely multifactorial including physical deconditioning and underlying COPD. Check echo to evaluate LVEF, wall motion, valvular abnormalities, and PASP. Smoking cessation is advised.   3. Severe HLD: Intolerant to Crestor, Lipitor, and simvastatin. She is willing to try Crestor 10 mg daily one-more time and  will let us know how this goes. Continue Zetia 10 mg daily. If she does not tolerate Crestor I recommend starting a PCSK-9 inhibitor if her insurance will cover this. Heart healthy diet discussed and recommended.   4. Bruit: Check carotid artery ultrasound.   5. Tobacco abuse: Complete cessation is advised. She is enrolled in a class through the cancer center.   Disposition: F/u with Dr. Kirke Corin or an APP in 3 months.   Current medicines are reviewed at length with the patient today.  The patient did not have any concerns regarding medicines.  Signed, Eula Listen, PA-C 10/30/2017 3:29 PM  [address] [place], [place] [address] [phone]

## 2017-10-31 ENCOUNTER — Ambulatory Visit (INDEPENDENT_AMBULATORY_CARE_PROVIDER_SITE_OTHER): Payer: Medicare Other | Admitting: Surgery

## 2017-10-31 ENCOUNTER — Encounter: Payer: Self-pay | Admitting: Surgery

## 2017-10-31 ENCOUNTER — Telehealth: Payer: Self-pay | Admitting: Physician Assistant

## 2017-10-31 VITALS — BP 112/78 | HR 74 | Temp 97.8°F | Wt 154.0 lb

## 2017-10-31 DIAGNOSIS — Z09 Encounter for follow-up examination after completed treatment for conditions other than malignant neoplasm: Secondary | ICD-10-CM

## 2017-10-31 MED ORDER — ROSUVASTATIN CALCIUM 10 MG PO TABS: 10.0000 mg | ORAL_TABLET | Freq: Every day | ORAL | 3 refills | Status: DC

## 2017-10-31 NOTE — Telephone Encounter (Signed)
No answer. Left message to call back.   

## 2017-10-31 NOTE — Patient Instructions (Signed)
Please give us a call in case you have any questions or concerns.  GENERAL POST-OPERATIVE PATIENT INSTRUCTIONS   WOUND CARE INSTRUCTIONS:  Keep a dry clean dressing on the wound if there is drainage. The initial bandage may be removed after 24 hours.  Once the wound has quit draining you may leave it open to air.  If clothing rubs against the wound or causes irritation and the wound is not draining you may cover it with a dry dressing during the daytime.  Try to keep the wound dry and avoid ointments on the wound unless directed to do so.  If the wound becomes bright red and painful or starts to drain infected material that is not clear, please contact your physician immediately.  If the wound is mildly pink and has a thick firm ridge underneath it, this is normal, and is referred to as a healing ridge.  This will resolve over the next 4-6 weeks.  BATHING: You may shower if you have been informed of this by your surgeon. However, Please do not submerge in a tub, hot tub, or pool until incisions are completely sealed or have been told by your surgeon that you may do so.  DIET:  You may eat any foods that you can tolerate.  It is a good idea to eat a high fiber diet and take in plenty of fluids to prevent constipation.  If you do become constipated you may want to take a mild laxative or take ducolax tablets on a daily basis until your bowel habits are regular.  Constipation can be very uncomfortable, along with straining, after recent surgery.  ACTIVITY:  You are encouraged to cough and deep breath or use your incentive spirometer if you were given one, every 15-30 minutes when awake.  This will help prevent respiratory complications and low grade fevers post-operatively if you had a general anesthetic.  You may want to hug a pillow when coughing and sneezing to add additional support to the surgical area, if you had abdominal or chest surgery, which will decrease pain during these times.  You are  encouraged to walk and engage in light activity for the next two weeks.  You should not lift more than 20 pounds, until 11/12/2017 as it could put you at increased risk for complications.  Twenty pounds is roughly equivalent to a plastic bag of groceries. At that time- Listen to your body when lifting, if you have pain when lifting, stop and then try again in a few days. Soreness after doing exercises or activities of daily living is normal as you get back in to your normal routine.  MEDICATIONS:  Try to take narcotic medications and anti-inflammatory medications, such as tylenol, ibuprofen, naprosyn, etc., with food.  This will minimize stomach upset from the medication.  Should you develop nausea and vomiting from the pain medication, or develop a rash, please discontinue the medication and contact your physician.  You should not drive, make important decisions, or operate machinery when taking narcotic pain medication.  SUNBLOCK Use sun block to incision area over the next year if this area will be exposed to sun. This helps decrease scarring and will allow you avoid a permanent darkened area over your incision.  QUESTIONS:  Please feel free to call our office if you have any questions, and we will be glad to assist you. [phone]

## 2017-10-31 NOTE — Telephone Encounter (Signed)
Pt calling stating when she got home her Crestor that she had was 40 mg She was told yesterday to start on 10 mg  She is calling to see if we can please call in the 10 mg to [facility]   Please advise

## 2017-10-31 NOTE — Telephone Encounter (Signed)
Pt is returning your call

## 2017-10-31 NOTE — Telephone Encounter (Signed)
Patient verbalized understanding to take Crestor 10 mg daily. Rx sent to pharmacy.

## 2017-10-31 NOTE — Telephone Encounter (Signed)
Patient had previously been advised to start Crestor 10 mg daily by PCP based on labs from 09/2017. She has a history of myalgias with statins, possibly including Crestor. I doubt she is going to be able to tolerating Crestor 40 mg daily given her history. I would like for her to try the 10 mg tabs to see if she tolerates them. Please call in Crestor 10 mg daily.

## 2017-10-31 NOTE — Telephone Encounter (Signed)
Please advise if ok to refill Crestor 10 mg tablet. Pt had OV yesterday w/Dunn pt was told to continue Crestor 10 mg tablet daily per [place] by Shea Evansunn. Please advise.

## 2017-10-31 NOTE — Telephone Encounter (Signed)
Routing to LimaRyan for clarification on dosage patient should start.

## 2017-10-31 NOTE — Progress Notes (Signed)
S/p lap chole by Dr. Aleen CampiPiscoya Doing well Path d/w pt + PO, no fevers  PE NAD Abd: soft, NT , incisions c/d/i, no infection  A/p Doing well No surgical issues No heavy lifting

## 2017-11-06 ENCOUNTER — Encounter
Admission: RE | Admit: 2017-11-06 | Discharge: 2017-11-06 | Disposition: A | Discharge status: Home / Self Care | Payer: Medicare Other | Source: Ambulatory Visit | Attending: Physician Assistant | Admitting: Physician Assistant

## 2017-11-06 DIAGNOSIS — R079 Chest pain, unspecified: Secondary | ICD-10-CM | POA: Diagnosis not present

## 2017-11-06 LAB — NM MYOCAR MULTI W/SPECT W/WALL MOTION / EF
LV dias vol: 48 mL (ref 46–106)
LV sys vol: 16 mL
Peak HR: 86 {beats}/min
Percent HR: 52 %
Rest HR: 63 {beats}/min
SDS: 0
SRS: 0
SSS: 0
TID: 0.88

## 2017-11-06 MED ORDER — TECHNETIUM TC 99M TETROFOSMIN IV KIT
13.0000 | PACK | Freq: Once | INTRAVENOUS | Status: AC | PRN
  Administered 2017-11-06: 13.38 via INTRAVENOUS

## 2017-11-06 MED ORDER — TECHNETIUM TC 99M TETROFOSMIN IV KIT
30.0000 | PACK | Freq: Once | INTRAVENOUS | Status: AC | PRN
  Administered 2017-11-06: 30.74 via INTRAVENOUS

## 2017-11-06 MED ORDER — REGADENOSON 0.4 MG/5ML IV SOLN
0.4000 mg | Freq: Once | INTRAVENOUS | Status: AC
  Administered 2017-11-06: 0.4 mg via INTRAVENOUS

## 2017-11-12 DIAGNOSIS — F322 Major depressive disorder, single episode, severe without psychotic features: Secondary | ICD-10-CM | POA: Diagnosis not present

## 2017-11-20 ENCOUNTER — Other Ambulatory Visit: Payer: Self-pay

## 2017-11-20 ENCOUNTER — Ambulatory Visit (INDEPENDENT_AMBULATORY_CARE_PROVIDER_SITE_OTHER): Payer: Medicare Other

## 2017-11-20 DIAGNOSIS — R0989 Other specified symptoms and signs involving the circulatory and respiratory systems: Secondary | ICD-10-CM | POA: Diagnosis not present

## 2017-11-20 DIAGNOSIS — R0602 Shortness of breath: Secondary | ICD-10-CM

## 2017-11-30 ENCOUNTER — Ambulatory Visit: Payer: Medicare Other | Admitting: Cardiovascular Disease

## 2017-11-30 ENCOUNTER — Encounter

## 2017-12-06 DIAGNOSIS — E7801 Familial hypercholesterolemia: Secondary | ICD-10-CM | POA: Diagnosis not present

## 2017-12-06 DIAGNOSIS — E162 Hypoglycemia, unspecified: Secondary | ICD-10-CM | POA: Diagnosis not present

## 2017-12-25 ENCOUNTER — Ambulatory Visit: Payer: Medicare Other | Admitting: Physician Assistant

## 2017-12-27 ENCOUNTER — Other Ambulatory Visit: Payer: Self-pay | Admitting: Family Medicine

## 2017-12-27 DIAGNOSIS — E785 Hyperlipidemia, unspecified: Secondary | ICD-10-CM

## 2018-01-08 ENCOUNTER — Encounter: Payer: Self-pay | Admitting: Family Medicine

## 2018-01-08 ENCOUNTER — Other Ambulatory Visit: Payer: Self-pay | Admitting: Family Medicine

## 2018-01-08 ENCOUNTER — Ambulatory Visit (INDEPENDENT_AMBULATORY_CARE_PROVIDER_SITE_OTHER): Payer: Medicare Other | Admitting: Family Medicine

## 2018-01-08 VITALS — BP 126/78 | HR 85 | Temp 98.2°F | Ht 66.0 in | Wt 149.5 lb

## 2018-01-08 DIAGNOSIS — M542 Cervicalgia: Secondary | ICD-10-CM

## 2018-01-08 DIAGNOSIS — R7303 Prediabetes: Secondary | ICD-10-CM | POA: Diagnosis not present

## 2018-01-08 DIAGNOSIS — E785 Hyperlipidemia, unspecified: Secondary | ICD-10-CM

## 2018-01-08 DIAGNOSIS — I10 Essential (primary) hypertension: Secondary | ICD-10-CM

## 2018-01-08 DIAGNOSIS — K219 Gastro-esophageal reflux disease without esophagitis: Secondary | ICD-10-CM

## 2018-01-08 DIAGNOSIS — I700 Atherosclerosis of aorta: Secondary | ICD-10-CM | POA: Diagnosis not present

## 2018-01-08 DIAGNOSIS — Z720 Tobacco use: Secondary | ICD-10-CM

## 2018-01-08 DIAGNOSIS — F419 Anxiety disorder, unspecified: Secondary | ICD-10-CM | POA: Diagnosis not present

## 2018-01-08 DIAGNOSIS — G8929 Other chronic pain: Secondary | ICD-10-CM

## 2018-01-08 MED ORDER — NICOTINE 21-14-7 MG/24HR TD KIT: PACK | TRANSDERMAL | 0 refills | Status: DC

## 2018-01-08 MED ORDER — OMEPRAZOLE 40 MG PO CPDR: 40.0000 mg | DELAYED_RELEASE_CAPSULE | Freq: Every day | ORAL | 2 refills | Status: DC

## 2018-01-08 NOTE — Assessment & Plan Note (Signed)
On omeprazole.  

## 2018-01-08 NOTE — Assessment & Plan Note (Signed)
Managed by psychiatrist

## 2018-01-08 NOTE — Progress Notes (Signed)
BP 126/78   Pulse 85   Temp 98.2 F (36.8 C)   Ht '5\' 6"'$  (1.676 m)   Wt 149 lb 8 oz (67.8 kg)   SpO2 94%   BMI 24.13 kg/m    Subjective:    Patient ID: Ann Pena, female    DOB: 1960-07-04, 57 y.o.   MRN: [id number]  HPI: Ann Pena is a 57 y.o. female  Chief Complaint  Patient presents with  . Follow-up  . Neck Pain    HPI Patient is here for follow-up Since her last visit, she underwent lap choly; she has a nuclear medicine stress test on 11/06/17  She has high cholesterol; referred to endocrinologist for same; she saw Dr. Gabriel Carina on 12/06/17; lab results reviewed with her by phone 12/17/17; total chol 127, TG 152, HDL 34.6, LDL 62; she is taking the crestor and zetia and lost weight; on slim-fast for a few months to get some of the weight off; eating salads; no red meat  Lab Results  Component Value Date   CHOL 308 (H) 09/20/2017   HDL 42 (L) 09/20/2017   LDLCALC 233 (H) 09/20/2017   TRIG 161 (H) 09/20/2017   CHOLHDL 7.3 (H) 09/20/2017   She went to the smoking class and wanted a prescription for patches; Burgess Estelle was helping Smoking 10 cigarettes by lunch, total in a day smoking 1 to 1.5 ppd She was down to like 5 cigarettes a day with the patch; her cut-off date was 12/18/17 She was told it was okay to smoke with the patch Her mother had a stroke in the last few weeks; no paralysis  Depression and PTSD; treated by therapist and psychiatrist at [facility]; she is on citalopram 40 mg and alprazolam and mirtazipine; she does not think the therapist is helping; she has been to see Bartholome Bill (therapist) and she's heard her say 20 words the whole time, patient says; she forgets half of the appointments; she goes there and if she doesn't talk, they just look at each other; she has not talked to Dr. Jacqualine Code about switching up therapists; she does not want to see another psychiatrist; she sleeps about two hours and then up again, up frequently through the night; no  thoughts of hurting herself  She had a stress test, see report  Her neck pops and cracks in the mornings; if she sits too long, she puts her hand up and holds it; numbness going down the right arm; snaps  Blood pressure is good  GERD; needs refills of the PPI  Depression screen [facility] 2/9 01/08/2018 09/20/2017 08/21/2017 12/06/2016 08/31/2016  Decreased Interest 3 0 3 0 0  Down, Depressed, Hopeless '3 3 3 1 '$ 0  PHQ - 2 Score '6 3 6 1 '$ 0  Altered sleeping '3 3 3 '$ - -  Tired, decreased energy '3 3 2 '$ - -  Change in appetite 0 3 3 - -  Feeling bad or failure about yourself  '3 3 3 '$ - -  Trouble concentrating '3 3 3 '$ - -  Moving slowly or fidgety/restless 0 0 0 - -  Suicidal thoughts 0 0 0 - -  PHQ-9 Score '18 18 20 '$ - -  Difficult doing work/chores Not difficult at all Somewhat difficult Extremely dIfficult - -   Fall Risk  01/08/2018 09/20/2017 08/21/2017 12/06/2016 08/31/2016  Falls in the past year? No Yes Yes No No  Comment - - doing yard work, got dizzy and fell - -  Number falls  in past yr: - 1 1 - -  Injury with Fall? - No No - -  Risk for fall due to : - - Impaired vision - -  Risk for fall due to: Comment - - wears eyeglasses; blurred vision; poor night vision - -  Follow up - - Falls evaluation completed;Education provided;Falls prevention discussed - -    Relevant past medical, surgical, family and social history reviewed Past Medical History:  Diagnosis Date  . Anxiety state, unspecified   . Aortic calcification ([place]) 09/20/2017  . Asthma   . 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 Chest pain, unspecified   . Chronic kidney disease, stage III (moderate) (HCC)   . Depression   . GERD (gastroesophageal reflux disease) 09/16/2014  . H/O endoscopy 05/30/2012   Overview:  Dr. Chauncey Cruel. Iftikhar--LA Grade B reflux esophagitis. Hiatus hernia. Gastritis. Overview:  Dr. Chauncey Cruel. Iftikhar--LA Grade B reflux esophagitis. Hiatus hernia. Gastritis.  . Hypertension 03/18/2012  . Infective otitis externa,  unspecified   . Murmur 10/17/2013  . Obesity, unspecified   . Other and unspecified hyperlipidemia   . Other nonspecific abnormal serum enzyme levels   . Pain in joint, lower leg   . Pain in limb   . Panic disorder without agoraphobia   . Posttraumatic stress disorder   . Prediabetes 09/20/2017  . Screening for malignant neoplasm of breast 04/04/2012  . Shortness of breath   . Syncope and collapse    Past Surgical History:  Procedure Laterality Date  . CHOLECYSTECTOMY N/A 10/15/2017   Procedure: LAPAROSCOPIC CHOLECYSTECTOMY;  Surgeon: Olean Ree, MD;  Location: [facility] ORS;  Service: General;  Laterality: N/A;  . SHOULDER SURGERY    . TOTAL ABDOMINAL HYSTERECTOMY     Family History  Problem Relation Age of Onset  . Hypertension Mother   . COPD Mother   . Heart disease Mother   . Stroke Mother   . Dementia Mother   . Hypertension Father   . Bone cancer Father   . Heart attack Father   . Heart disease Father   . Thyroid disease Sister    Social History   Tobacco Use  . Smoking status: Current Every Day Smoker    Packs/day: 1.00    Years: 42.00    Pack years: 42.00    Types: Cigarettes  . Smokeless tobacco: Never Used  Substance Use Topics  . Alcohol use: No  . Drug use: No     Office Visit from 01/08/2018 in [facility]  AUDIT-C Score  0      Interim medical history since last visit reviewed. Allergies and medications reviewed  Review of Systems Per HPI unless specifically indicated above     Objective:    BP 126/78   Pulse 85   Temp 98.2 F (36.8 C)   Ht '5\' 6"'$  (1.676 m)   Wt 149 lb 8 oz (67.8 kg)   SpO2 94%   BMI 24.13 kg/m   Wt Readings from Last 3 Encounters:  01/08/18 149 lb 8 oz (67.8 kg)  10/31/17 154 lb (69.9 kg)  10/30/17 154 lb (69.9 kg)    Physical Exam  Constitutional: She appears well-developed and well-nourished. No distress.  HENT:  Head: Normocephalic and atraumatic.  Eyes: EOM are normal. No scleral icterus.    Neck: No thyromegaly present.  Cardiovascular: Normal rate, regular rhythm and normal heart sounds.  No murmur heard. Pulmonary/Chest: Effort normal and breath sounds normal. No respiratory distress. She has  no wheezes.  Abdominal: Soft. Bowel sounds are normal. She exhibits no distension.  Musculoskeletal: She exhibits no edema.  Neurological: She is alert.  Skin: Skin is warm and dry. She is not diaphoretic. No pallor.  Psychiatric: She has a normal mood and affect. Her behavior is normal. Judgment and thought content normal.    Results for orders placed or performed during the hospital encounter of 11/06/17  NM Myocar Multi W/Spect W/Wall Motion / EF  Result Value Ref Range   Rest HR 63 bpm   Rest BP 111/74 mmHg   Percent HR 52 %   Peak HR 86 bpm   Peak BP 121/80 mmHg   SSS 0    SRS 0    SDS 0    TID 0.88    LV sys vol 16 mL   LV dias vol 48 46 - 106 mL      Assessment & Plan:   Problem List Items Addressed This Visit      Cardiovascular and Mediastinum   Hypertension    Controlled today      Aortic calcification (HCC)    Goal LDL less than 70; will help her try to quit smoking        Digestive   GERD (gastroesophageal reflux disease)    On omeprazole      Relevant Medications   omeprazole (PRILOSEC) 40 MG capsule     Other   Tobacco use    Encouragement given to quit      Prediabetes    Last A1c showed excellent average sugar; proud of her healthier eating habits      Dyslipidemia    I am thrilled with the drop in the LDL; limit saturated fats      Anxiety    Managed by psychiatrist       Other Visit Diagnoses    Neck pain, chronic    -  Primary   refer to ortho   Relevant Orders   Ambulatory referral to Orthopedic Surgery       Follow up plan: Return in about 3 months (around 04/10/2018) for follow-up visit with Dr. Sanda Klein.  An after-visit summary was printed and given to the patient at [place].  Please see the patient instructions  which may contain other information and recommendations beyond what is mentioned above in the assessment and plan.  Meds ordered this encounter  Medications  . omeprazole (PRILOSEC) 40 MG capsule    Sig: Take 1 capsule (40 mg total) by mouth daily.    Dispense:  30 capsule    Refill:  2    30 pills only, will talk to her about H2 blocker  . Nicotine 21-14-7 MG/24HR KIT    Sig: Dispense 1 taper kit; use as directed    Dispense:  1 each    Refill:  0    Orders Placed This Encounter  Procedures  . Ambulatory referral to Orthopedic Surgery

## 2018-01-08 NOTE — Assessment & Plan Note (Signed)
Goal LDL less than 70; will help her try to quit smoking

## 2018-01-08 NOTE — Assessment & Plan Note (Signed)
Encouragement given to quit 

## 2018-01-08 NOTE — Assessment & Plan Note (Signed)
Last A1c showed excellent average sugar; proud of her healthier eating habits

## 2018-01-08 NOTE — Patient Instructions (Addendum)
I do encourage you to quit smoking Call [phone] to sign up for smoking cessation classes You can call 1-800-QUIT-NOW to talk with a smoking cessation coach   Steps to Quit Smoking Smoking tobacco can be harmful to your health and can affect almost every organ in your body. Smoking puts you, and those around you, at risk for developing many serious chronic diseases. Quitting smoking is difficult, but it is one of the best things that you can do for your health. It is never too late to quit. What are the benefits of quitting smoking? When you quit smoking, you lower your risk of developing serious diseases and conditions, such as:  Lung cancer or lung disease, such as COPD.  Heart disease.  Stroke.  Heart attack.  Infertility.  Osteoporosis and bone fractures.  Additionally, symptoms such as coughing, wheezing, and shortness of breath may get better when you quit. You may also find that you get sick less often because your body is stronger at fighting off colds and infections. If you are pregnant, quitting smoking can help to reduce your chances of having a baby of low birth weight. How do I get ready to quit? When you decide to quit smoking, create a plan to make sure that you are successful. Before you quit:  Pick a date to quit. Set a date within the next two weeks to give you time to prepare.  Write down the reasons why you are quitting. Keep this list in places where you will see it often, such as on your bathroom mirror or in your car or wallet.  Identify the people, places, things, and activities that make you want to smoke (triggers) and avoid them. Make sure to take these actions: ? Throw away all cigarettes at home, at work, and in your car. ? Throw away smoking accessories, such as Set designer. ? Clean your car and make sure to empty the ashtray. ? Clean your home, including curtains and carpets.  Tell your family, friends, and coworkers that you are  quitting. Support from your loved ones can make quitting easier.  Talk with your health care provider about your options for quitting smoking.  Find out what treatment options are covered by your health insurance.  What strategies can I use to quit smoking? Talk with your healthcare provider about different strategies to quit smoking. Some strategies include:  Quitting smoking altogether instead of gradually lessening how much you smoke over a period of time. Research shows that quitting "cold [place]" is more successful than gradually quitting.  Attending in-person counseling to help you build problem-solving skills. You are more likely to have success in quitting if you attend several counseling sessions. Even short sessions of 10 minutes can be effective.  Finding resources and support systems that can help you to quit smoking and remain smoke-free after you quit. These resources are most helpful when you use them often. They can include: ? Online chats with a Veterinary surgeon. ? Telephone quitlines. ? Automotive engineer. ? Support groups or group counseling. ? Text messaging programs. ? Mobile phone applications.  Taking medicines to help you quit smoking. (If you are pregnant or breastfeeding, talk with your health care provider first.) Some medicines contain nicotine and some do not. Both types of medicines help with cravings, but the medicines that include nicotine help to relieve withdrawal symptoms. Your health care provider may recommend: ? Nicotine patches, gum, or lozenges. ? Nicotine inhalers or sprays. ? Non-nicotine medicine that is  taken by mouth.  Talk with your health care provider about combining strategies, such as taking medicines while you are also receiving in-person counseling. Using these two strategies together makes you more likely to succeed in quitting than if you used either strategy on its own. If you are pregnant or breastfeeding, talk with your health care  provider about finding counseling or other support strategies to quit smoking. Do not take medicine to help you quit smoking unless told to do so by your health care provider. What things can I do to make it easier to quit? Quitting smoking might feel overwhelming at first, but there is a lot that you can do to make it easier. Take these important actions:  Reach out to your family and friends and ask that they support and encourage you during this time. Call telephone quitlines, reach out to support groups, or work with a counselor for support.  Ask people who smoke to avoid smoking around you.  Avoid places that trigger you to smoke, such as bars, parties, or smoke-break areas at work.  Spend time around people who do not smoke.  Lessen stress in your life, because stress can be a smoking trigger for some people. To lessen stress, try: ? Exercising regularly. ? Deep-breathing exercises. ? Yoga. ? Meditating. ? Performing a body scan. This involves closing your eyes, scanning your body from head to toe, and noticing which parts of your body are particularly tense. Purposefully relax the muscles in those areas.  Download or purchase mobile phone or tablet apps (applications) that can help you stick to your quit plan by providing reminders, tips, and encouragement. There are many free apps, such as QuitGuide from the [employer] developer for Disease Control and Prevention). You can find other support for quitting smoking (smoking cessation) through smokefree.gov and other websites.  How will I feel when I quit smoking? Within the first 24 hours of quitting smoking, you may start to feel some withdrawal symptoms. These symptoms are usually most noticeable 2-3 days after quitting, but they usually do not last beyond 2-3 weeks. Changes or symptoms that you might experience include:  Mood swings.  Restlessness, anxiety, or irritation.  Difficulty concentrating.  Dizziness.  Strong cravings for  sugary foods in addition to nicotine.  Mild weight gain.  Constipation.  Nausea.  Coughing or a sore throat.  Changes in how your medicines work in your body.  A depressed mood.  Difficulty sleeping (insomnia).  After the first 2-3 weeks of quitting, you may start to notice more positive results, such as:  Improved sense of smell and taste.  Decreased coughing and sore throat.  Slower heart rate.  Lower blood pressure.  Clearer skin.  The ability to breathe more easily.  Fewer sick days.  Quitting smoking is very challenging for most people. Do not get discouraged if you are not successful the first time. Some people need to make many attempts to quit before they achieve long-term success. Do your best to stick to your quit plan, and talk with your health care provider if you have any questions or concerns. This information is not intended to replace advice given to you by your health care provider. Make sure you discuss any questions you have with your health care provider. Document Released: 03/14/2001 Document Revised: 11/16/2015 Document Reviewed: 08/04/2014 Elsevier Interactive Patient Education  2018 [employer].  Coping with Quitting Smoking Quitting smoking is a physical and mental challenge. You will face cravings, withdrawal symptoms, and temptation.  Before quitting, work with your health care provider to make a plan that can help you cope. Preparation can help you quit and keep you from giving in. How can I cope with cravings? Cravings usually last for 5-10 minutes. If you get through it, the craving will pass. Consider taking the following actions to help you cope with cravings:  Keep your mouth busy: ? Chew sugar-free gum. ? Suck on hard candies or a straw. ? Brush your teeth.  Keep your hands and body busy: ? Immediately change to a different activity when you feel a craving. ? Squeeze or play with a ball. ? Do an activity or a hobby, like making bead  jewelry, practicing needlepoint, or working with wood. ? Mix up your normal routine. ? Take a short exercise break. Go for a quick walk or run up and down stairs. ? Spend time in public places where smoking is not allowed.  Focus on doing something kind or helpful for someone else.  Call a friend or family member to talk during a craving.  Join a support group.  Call a quit line, such as 1-800-QUIT-NOW.  Talk with your health care provider about medicines that might help you cope with cravings and make quitting easier for you.  How can I deal with withdrawal symptoms? Your body may experience negative effects as it tries to get used to not having nicotine in the system. These effects are called withdrawal symptoms. They may include:  Feeling hungrier than normal.  Trouble concentrating.  Irritability.  Trouble sleeping.  Feeling depressed.  Restlessness and agitation.  Craving a cigarette.  To manage withdrawal symptoms:  Avoid places, people, and activities that trigger your cravings.  Remember why you want to quit.  Get plenty of sleep.  Avoid coffee and other caffeinated drinks. These may worsen some of your symptoms.  How can I handle social situations? Social situations can be difficult when you are quitting smoking, especially in the first few weeks. To manage this, you can:  Avoid parties, bars, and other social situations where people might be smoking.  Avoid alcohol.  Leave right away if you have the urge to smoke.  Explain to your family and friends that you are quitting smoking. Ask for understanding and support.  Plan activities with friends or family where smoking is not an option.  What are some ways I can cope with stress? Wanting to smoke may cause stress, and stress can make you want to smoke. Find ways to manage your stress. Relaxation techniques can help. For example:  Breathe slowly and deeply, in through your nose and out through your  mouth.  Listen to soothing, relaxing music.  Talk with a family member or friend about your stress.  Light a candle.  Soak in a bath or take a shower.  Think about a peaceful place.  What are some ways I can prevent weight gain? Be aware that many people gain weight after they quit smoking. However, not everyone does. To keep from gaining weight, have a plan in place before you quit and stick to the plan after you quit. Your plan should include:  Having healthy snacks. When you have a craving, it may help to: ? Eat plain popcorn, crunchy carrots, celery, or other cut vegetables. ? Chew sugar-free gum.  Changing how you eat: ? Eat small portion sizes at meals. ? Eat 4-6 small meals throughout the day instead of 1-2 large meals a day. ? Be mindful when you  eat. Do not watch television or do other things that might distract you as you eat.  Exercising regularly: ? Make time to exercise each day. If you do not have time for a long workout, do short bouts of exercise for 5-10 minutes several times a day. ? Do some form of strengthening exercise, like weight lifting, and some form of aerobic exercise, like running or swimming.  Drinking plenty of water or other low-calorie or no-calorie drinks. Drink 6-8 glasses of water daily, or as much as instructed by your health care provider.  Summary  Quitting smoking is a physical and mental challenge. You will face cravings, withdrawal symptoms, and temptation to smoke again. Preparation can help you as you go through these challenges.  You can cope with cravings by keeping your mouth busy (such as by chewing gum), keeping your body and hands busy, and making calls to family, friends, or a helpline for people who want to quit smoking.  You can cope with withdrawal symptoms by avoiding places where people smoke, avoiding drinks with caffeine, and getting plenty of rest.  Ask your health care provider about the different ways to prevent weight  gain, avoid stress, and handle social situations. This information is not intended to replace advice given to you by your health care provider. Make sure you discuss any questions you have with your health care provider. Document Released: 03/17/2016 Document Revised: 03/17/2016 Document Reviewed: 03/17/2016 Elsevier Interactive Patient Education  [employer].

## 2018-01-08 NOTE — Assessment & Plan Note (Signed)
Controlled today 

## 2018-01-08 NOTE — Assessment & Plan Note (Signed)
I am thrilled with the drop in the LDL; limit saturated fats

## 2018-03-04 ENCOUNTER — Ambulatory Visit: Payer: Medicare Other

## 2018-04-05 DIAGNOSIS — F4310 Post-traumatic stress disorder, unspecified: Secondary | ICD-10-CM | POA: Diagnosis not present

## 2018-04-26 ENCOUNTER — Other Ambulatory Visit: Payer: Self-pay | Admitting: Family Medicine

## 2018-04-29 ENCOUNTER — Other Ambulatory Visit: Payer: Self-pay | Admitting: Family Medicine

## 2018-04-29 DIAGNOSIS — K219 Gastro-esophageal reflux disease without esophagitis: Secondary | ICD-10-CM

## 2018-04-29 MED ORDER — OMEPRAZOLE 20 MG PO CPDR: 20.0000 mg | DELAYED_RELEASE_CAPSULE | Freq: Every day | ORAL | 0 refills | Status: DC

## 2018-04-29 NOTE — Telephone Encounter (Signed)
I will try to wean patient down from this medicine, from 40 mg daily to 20 mg daily Encourage patient to avoid triggers Don't eat for 2-3 hours before bed Elevated HOB to avoid acid reflux during sleep Lose weight is overweight Quit smoking if smoking Etc. Call with any issues

## 2018-04-29 NOTE — Telephone Encounter (Signed)
Copied from CRM [id number]. Topic: Quick Communication - Rx Refill/Question >> Apr 29, 2018  4:03 PM Baldo Daub L wrote: Medication: omeprazole (PRILOSEC) 40 MG capsule  Has the patient contacted their pharmacy? Yes - states that they sent in a request for this on 04/26/2018 and haven't heard from [place] (Agent: If no, request that the patient contact the pharmacy for the refill.) (Agent: If yes, when and what did the pharmacy advise?)  Preferred Pharmacy (with phone number or street name): [id number] [place], [place] - [address] AT [facility] OF [address] [phone] (Phone) [phone] (Fax)  Agent: Please be advised that RX refills may take up to 3 business days. We ask that you follow-up with your pharmacy.

## 2018-04-30 NOTE — Telephone Encounter (Signed)
Pt.notified

## 2018-05-27 DIAGNOSIS — F4310 Post-traumatic stress disorder, unspecified: Secondary | ICD-10-CM | POA: Diagnosis not present

## 2018-06-03 ENCOUNTER — Telehealth: Payer: Self-pay | Admitting: Family Medicine

## 2018-06-03 NOTE — Telephone Encounter (Signed)
Copied from CRM [id number]. Topic: Quick Communication - Rx Refill/Question >> Jun 03, 2018 11:00 AM Saverio Danker J wrote: Medication: omeprazole (PRILOSEC) 20 MG capsule  Has the patient contacted their pharmacy? No. (Agent: If no, request that the patient contact the pharmacy for the refill.) (Agent: If yes, when and what did the pharmacy advise?)  Preferred Pharmacy (with phone number or street name): [facility] [address]  Changed pharmacy  Is also asking for a refills    Agent: Please be advised that RX refills may take up to 3 business days. We ask that you follow-up with your pharmacy.

## 2018-06-04 ENCOUNTER — Other Ambulatory Visit: Payer: Self-pay | Admitting: Family Medicine

## 2018-06-04 MED ORDER — OMEPRAZOLE 20 MG PO CPDR: 20.0000 mg | DELAYED_RELEASE_CAPSULE | Freq: Every day | ORAL | 0 refills | Status: DC

## 2018-06-04 NOTE — Telephone Encounter (Signed)
See 04/29/2018 note Let's wean her down a little more off of the omeprazole Take this medicine just every other day Really avoid NSAIDs, acidic foods, don't eat for 3 hours before bed, avoid coffee and smoking, and other lifestyle habits that affect reflux Call us if symptoms worsen

## 2018-06-04 NOTE — Telephone Encounter (Signed)
Patient was supposed to be seen in January Please schedule appointment first available I'll refill med

## 2018-06-04 NOTE — Telephone Encounter (Signed)
Left voice message asking pt to return call

## 2018-06-05 ENCOUNTER — Other Ambulatory Visit: Payer: Self-pay

## 2018-06-05 MED ORDER — OMEPRAZOLE 20 MG PO CPDR: 20.0000 mg | DELAYED_RELEASE_CAPSULE | ORAL | 0 refills | Status: DC

## 2018-06-05 NOTE — Telephone Encounter (Signed)
Pt called with new updated pharmacy

## 2018-06-05 NOTE — Telephone Encounter (Signed)
I canceled Rx at other pharmacy New Rx sent

## 2018-06-05 NOTE — Telephone Encounter (Signed)
Left detailed voicemail

## 2018-07-22 ENCOUNTER — Other Ambulatory Visit: Payer: Self-pay | Admitting: Family Medicine

## 2018-07-23 ENCOUNTER — Telehealth: Payer: Self-pay | Admitting: Cardiovascular Disease

## 2018-07-23 MED ORDER — ALBUTEROL SULFATE HFA 108 (90 BASE) MCG/ACT IN AERS: 2.0000 | INHALATION_SPRAY | RESPIRATORY_TRACT | 0 refills | Status: DC | PRN

## 2018-07-23 NOTE — Telephone Encounter (Signed)
Virtual Visit Pre-Appointment Phone Call  "(Name), I am calling you today to discuss your upcoming appointment. We are currently trying to limit exposure to the virus that causes COVID-19 by seeing patients at home rather than in the office."  1. "What is the BEST phone number to call the day of the visit?" - include this in appointment notes  2. "Do you have or have access to (through a family member/friend) a smartphone with video capability that we can use for your visit?" a. If yes - list this number in appt notes as "cell" (if different from BEST phone #) and list the appointment type as a VIDEO visit in appointment notes b. If no - list the appointment type as a PHONE visit in appointment notes  3. Confirm consent - "In the setting of the current Covid19 crisis, you are scheduled for a (phone or video) visit with your provider on (date) at (time).  Just as we do with many in-office visits, in order for you to participate in this visit, we must obtain consent.  If you'd like, I can send this to your mychart (if signed up) or email for you to review.  Otherwise, I can obtain your verbal consent now.  All virtual visits are billed to your insurance company just like a normal visit would be.  By agreeing to a virtual visit, we'd like you to understand that the technology does not allow for your provider to perform an examination, and thus may limit your provider's ability to fully assess your condition. If your provider identifies any concerns that need to be evaluated in person, we will make arrangements to do so.  Finally, though the technology is pretty good, we cannot assure that it will always work on either your or our end, and in the setting of a video visit, we may have to convert it to a phone-only visit.  In either situation, we cannot ensure that we have a secure connection.  Are you willing to proceed?" STAFF: Did the patient verbally acknowledge consent to telehealth visit? Document  YES/NO here: YES  4. Advise patient to be prepared - "Two hours prior to your appointment, go ahead and check your blood pressure, pulse, oxygen saturation, and your weight (if you have the equipment to check those) and write them all down. When your visit starts, your provider will ask you for this information. If you have an Apple Watch or Kardia device, please plan to have heart rate information ready on the day of your appointment. Please have a pen and paper handy nearby the day of the visit as well."  5. Give patient instructions for MyChart download to smartphone OR Doximity/Doxy.me as below if video visit (depending on what platform provider is using)  6. Inform patient they will receive a phone call 15 minutes prior to their appointment time (may be from unknown caller ID) so they should be prepared to answer    TELEPHONE CALL NOTE  CHARNISSA SULAK has been deemed a candidate for a follow-up tele-health visit to limit community exposure during the Covid-19 pandemic. I spoke with the patient via phone to ensure availability of phone/video source, confirm preferred email & phone number, and discuss instructions and expectations.  I reminded Ann Pena to be prepared with any vital sign and/or heart rhythm information that could potentially be obtained via home monitoring, at the time of her visit. I reminded Ann Pena to expect a phone call prior to  her visit.  Sandre KittyGilley, Chasin Findling 07/23/2018 1:56 PM   INSTRUCTIONS FOR DOWNLOADING THE MYCHART APP TO SMARTPHONE  - The patient must first make sure to have activated MyChart and know their login information - If Apple, go to [employer] Store and type in MyChart in the search bar and download the app. If Android, ask patient to go to [employer] Play Store and type in OrangeMyChart in the search bar and download the app. The app is free but as with any other app downloads, their phone may require them to verify saved payment information or  Apple/Android password.  - The patient will need to then log into the app with their MyChart username and password, and select [place] as their healthcare provider to link the account. When it is time for your visit, go to the MyChart app, find appointments, and click Begin Video Visit. Be sure to Select Allow for your device to access the Microphone and Camera for your visit. You will then be connected, and your provider will be with you shortly.  **If they have any issues connecting, or need assistance please contact MyChart service desk [phone])**  **If using a computer, in order to ensure the best quality for their visit they will need to use either of the following Internet Browsers: [employer] Edge, or Google Chrome**  IF USING DOXIMITY or DOXY.ME - The patient will receive a link just prior to their visit by text.     FULL LENGTH CONSENT FOR TELE-HEALTH VISIT   I hereby voluntarily request, consent and authorize [facility] and its employed or contracted physicians, physician assistants, nurse practitioners or other licensed health care professionals (the Practitioner), to provide me with telemedicine health care services (the "Services") as deemed necessary by the treating Practitioner. I acknowledge and consent to receive the Services by the Practitioner via telemedicine. I understand that the telemedicine visit will involve communicating with the Practitioner through live audiovisual communication technology and the disclosure of certain medical information by electronic transmission. I acknowledge that I have been given the opportunity to request an in-person assessment or other available alternative prior to the telemedicine visit and am voluntarily participating in the telemedicine visit.  I understand that I have the right to withhold or withdraw my consent to the use of telemedicine in the course of my care at any time, without affecting my right to future care  or treatment, and that the Practitioner or I may terminate the telemedicine visit at any time. I understand that I have the right to inspect all information obtained and/or recorded in the course of the telemedicine visit and may receive copies of available information for a reasonable fee.  I understand that some of the potential risks of receiving the Services via telemedicine include:  Marland Kitchen. Delay or interruption in medical evaluation due to technological equipment failure or disruption; . Information transmitted may not be sufficient (e.g. poor resolution of images) to allow for appropriate medical decision making by the Practitioner; and/or  . In rare instances, security protocols could fail, causing a breach of personal health information.  Furthermore, I acknowledge that it is my responsibility to provide information about my medical history, conditions and care that is complete and accurate to the best of my ability. I acknowledge that Practitioner's advice, recommendations, and/or decision may be based on factors not within their control, such as incomplete or inaccurate data provided by me or distortions of diagnostic images or specimens that may result from electronic transmissions. I understand  that the practice of medicine is not an exact science and that Practitioner makes no warranties or guarantees regarding treatment outcomes. I acknowledge that I will receive a copy of this consent concurrently upon execution via email to the email address I last provided but may also request a printed copy by calling the office of [place].    I understand that my insurance will be billed for this visit.   I have read or had this consent read to me. . I understand the contents of this consent, which adequately explains the benefits and risks of the Services being provided via telemedicine.  . I have been provided ample opportunity to ask questions regarding this consent and the Services and have had  my questions answered to my satisfaction. . I give my informed consent for the services to be provided through the use of telemedicine in my medical care  By participating in this telemedicine visit I agree to the above.

## 2018-07-24 ENCOUNTER — Encounter: Payer: Self-pay | Admitting: Cardiovascular Disease

## 2018-07-24 ENCOUNTER — Telehealth (INDEPENDENT_AMBULATORY_CARE_PROVIDER_SITE_OTHER): Payer: Medicare Other | Admitting: Cardiovascular Disease

## 2018-07-24 ENCOUNTER — Other Ambulatory Visit: Payer: Self-pay

## 2018-07-24 DIAGNOSIS — E785 Hyperlipidemia, unspecified: Secondary | ICD-10-CM | POA: Diagnosis not present

## 2018-07-24 MED ORDER — EZETIMIBE 10 MG PO TABS: 10.0000 mg | ORAL_TABLET | Freq: Every day | ORAL | 6 refills | Status: DC

## 2018-07-24 MED ORDER — ROSUVASTATIN CALCIUM 5 MG PO TABS: 5.0000 mg | ORAL_TABLET | Freq: Every day | ORAL | 6 refills | Status: DC

## 2018-07-24 NOTE — Patient Instructions (Signed)
Medication Instructions:  Resume Zetia 10 mg daily and rosuvastatin 5 mg once daily If you need a refill on your cardiac medications before your next appointment, please call your pharmacy.   Lab work: None If you have labs (blood work) drawn today and your tests are completely normal, you will receive your results only by: Marland Kitchen MyChart Message (if you have MyChart) OR . A paper copy in the mail If you have any lab test that is abnormal or we need to change your treatment, we will call you to review the results.  Testing/Procedures: None  Follow-Up: At [facility], you and your health needs are our priority.  As part of our continuing mission to provide you with exceptional heart care, we have created designated Provider Care Teams.  These Care Teams include your primary Cardiologist (physician) and Advanced Practice Providers (APPs -  Physician Assistants and Nurse Practitioners) who all work together to provide you with the care you need, when you need it. You will need a follow up appointment in 1 years.  Please call our office 2 months in advance to schedule this appointment.  You may see Lorine Bears, MD or one of the following Advanced Practice Providers on your designated Care Team:   Nicolasa Ducking, NP Eula Listen, PA-C . Marisue Ivan, PA-C

## 2018-07-24 NOTE — Progress Notes (Signed)
Virtual Visit via Telephone Note   This visit type was conducted due to national recommendations for restrictions regarding the COVID-19 Pandemic (e.g. social distancing) in an effort to limit this patient's exposure and mitigate transmission in our community.  Due to her co-morbid illnesses, this patient is at least at moderate risk for complications without adequate follow up.  This format is felt to be most appropriate for this patient at this time.  The patient did not have access to video technology/had technical difficulties with video requiring transitioning to audio format only (telephone).  All issues noted in this document were discussed and addressed.  No physical exam could be performed with this format.  Please refer to the patient's chart for her  consent to telehealth for [facility].   Evaluation Performed:  Follow-up visit  Date:  07/24/2018   ID:  Ann Pena, Ann Pena 24-Apr-1960, MRN [id number]  Patient Location: Home Provider Location: Office  PCP:  Kerman Passey, MD  Cardiologist:  Lorine Bears, MD  Electrophysiologist:  None   Chief Complaint: Follow-up visit regarding hyperlipidemia  History of Present Illness:    Ann Pena is a 58 y.o. female who was evaluated via phone visit. She has chronic medical conditions that include recurrent atypical chest pain,severe anxiety and PTSD, severe hyperlipidemia with intolerance to statins, and ongoing tobacco abuse .  She has been evaluated since 2015 for atypical chest pain. She has dealt with severe anxiety and PTSD since her son was murdered in 2011.   She was evaluated last year for atypical chest pain and shortness of breath.  Lexiscan Myoview was normal.  Echocardiogram showed normal LV systolic function and no significant valvular abnormalities. She is known to have severe hyperlipidemia with intolerance to statins and was started on small dose rosuvastatin last year.  She is also on Zetia. 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  Follow-up lipid profile in September showed significant improvement with an LDL down to 62 from above 485. She stopped taking both Zetia and rosuvastatin due to myalgia.  She denies any chest pain or worsening dyspnea.  She continues to deal with significant anxiety symptoms.  The patient does not have symptoms concerning for COVID-19 infection (fever, chills, cough, or new shortness of breath).    Past Medical History:  Diagnosis Date  . Anxiety state, unspecified   . Aortic calcification (HCC) 09/20/2017  . Asthma   . 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 Chest pain, unspecified   . Chronic kidney disease, stage III (moderate) (HCC)   . Depression   . GERD (gastroesophageal reflux disease) 09/16/2014  . H/O endoscopy 05/30/2012   Overview:  Dr. Kathie Rhodes. Iftikhar--LA Grade B reflux esophagitis. Hiatus hernia. Gastritis. Overview:  Dr. Kathie Rhodes. Iftikhar--LA Grade B reflux esophagitis. Hiatus hernia. Gastritis.  . Hypertension 03/18/2012  . Infective otitis externa, unspecified   . Murmur 10/17/2013  . Obesity, unspecified   . Other and unspecified hyperlipidemia   . Other nonspecific abnormal serum enzyme levels   . Pain in joint, lower leg   . Pain in limb   . Panic disorder without agoraphobia   . Posttraumatic stress disorder   . Prediabetes 09/20/2017  . Screening for malignant neoplasm of breast 04/04/2012  . Shortness of breath   . Syncope and collapse    Past Surgical History:  Procedure Laterality Date  . CHOLECYSTECTOMY N/A 10/15/2017   Procedure: LAPAROSCOPIC CHOLECYSTECTOMY;  Surgeon: Henrene Dodge, MD;  Location: [facility] ORS;  Service: General;  Laterality: N/A;  . SHOULDER SURGERY    . TOTAL ABDOMINAL HYSTERECTOMY       Current Meds  Medication Sig  . albuterol (VENTOLIN HFA) 108 (90 Base) MCG/ACT inhaler Inhale 2 puffs into the lungs every 4 (four) hours as needed for wheezing or shortness of breath.   . ALPRAZolam (XANAX) 1 MG tablet Take 0.5 mg by mouth 4 (four) times daily.   . citalopram (CELEXA) 40 MG tablet Take 40 mg by mouth daily.   Marland Kitchen ibuprofen (ADVIL,MOTRIN) 600 MG tablet Take 1 tablet (600 mg total) by mouth every 8 (eight) hours as needed.  . mirtazapine (REMERON) 45 MG tablet Take 45 mg by mouth at bedtime.   Marland Kitchen omeprazole (PRILOSEC) 20 MG capsule Take 1 capsule (20 mg total) by mouth every other day.     Allergies:   Yellow jacket venom [bee venom]; Penicillins; and Sulfa antibiotics   Social History   Tobacco Use  . Smoking status: Current Every Day Smoker    Packs/day: 1.00    Years: 42.00    Pack years: 42.00    Types: Cigarettes  . Smokeless tobacco: Never Used  Substance Use Topics  . Alcohol use: No  . Drug use: No     Family Hx: The patient's family history includes Bone cancer in her father; COPD in her mother; Dementia in her mother; Heart attack in her father; Heart disease in her father and mother; Hypertension in her father and mother; Stroke in her mother; Thyroid disease in her sister.  ROS:   Please see the history of present illness.     All other systems reviewed and are negative.   Prior CV studies:   The following studies were reviewed today:  I reviewed results of stress test and echocardiogram from last year with her.  Labs/Other Tests and Data Reviewed:    EKG:  No ECG reviewed.  Recent Labs: 09/20/2017: ALT 6; BUN 6; Creat 1.10; Potassium 4.4; Sodium 139   Recent Lipid Panel Lab Results  Component Value Date/Time   CHOL 308 (H) 09/20/2017 11:32 AM   CHOL 276 (H) 07/26/2015 11:49 AM   TRIG 161 (H) 09/20/2017 11:32 AM   HDL 42 (L) 09/20/2017 11:32 AM   HDL 30 (L) 07/26/2015 11:49 AM   CHOLHDL 7.3 (H) 09/20/2017 11:32 AM   LDLCALC 233 (H) 09/20/2017 11:32 AM    Wt Readings from Last 3 Encounters:  07/24/18 150 lb (68 kg)  01/08/18 149 lb 8 oz (67.8 kg)  10/31/17 154 lb (69.9 kg)     Objective:    Vital Signs:  Ht 5'  5.5" (1.664 m)   Wt 150 lb (68 kg)   BMI 24.58 kg/m      ASSESSMENT & PLAN:    1. Familial hyperlipidemia: LDL greater than 200.  Significant improvement with rosuvastatin and Zetia with LDL down to 67.  However, she stopped both medications due to myalgia.  I discussed with her the importance of treatment and she is willing to resume these medications but I elected to resume rosuvastatin at a lower dose of 5 mg daily.  Continue Zetia 10 mg daily. 2. Recurrent atypical chest pain: Negative cardiac work-up.  COVID-19 Education: The signs and symptoms of COVID-19 were discussed with the patient and how to seek care for testing (follow up with PCP or arrange E-visit).  The importance of social distancing was discussed today.  Time:   Today, I have spent  18 minutes with the patient with telehealth technology discussing the above problems.     Medication Adjustments/Labs and Tests Ordered: Current medicines are reviewed at length with the patient today.  Concerns regarding medicines are outlined above.   Tests Ordered: No orders of the defined types were placed in this encounter.   Medication Changes: No orders of the defined types were placed in this encounter.   Disposition:  Follow up in 1 year(s)  Signed, Lorine BearsMuhammad Lauryn Lizardi, MD  07/24/2018 1:00 PM    [facility]

## 2018-10-11 ENCOUNTER — Telehealth: Payer: Self-pay | Admitting: *Deleted

## 2018-10-11 NOTE — Telephone Encounter (Signed)
Left message for patient to notify them that it is time to schedule annual low dose lung cancer screening CT scan. Instructed patient to call back to verify information prior to the scan being scheduled.  

## 2018-12-13 ENCOUNTER — Ambulatory Visit (INDEPENDENT_AMBULATORY_CARE_PROVIDER_SITE_OTHER): Payer: 59 | Admitting: Family Medicine

## 2018-12-13 ENCOUNTER — Emergency Department
Admission: EM | Admit: 2018-12-13 | Discharge: 2018-12-14 | Disposition: A | Discharge status: Home / Self Care | Payer: 59 | Attending: Emergency Medicine | Admitting: Emergency Medicine

## 2018-12-13 ENCOUNTER — Other Ambulatory Visit: Payer: Self-pay

## 2018-12-13 ENCOUNTER — Encounter: Payer: Self-pay | Admitting: Family Medicine

## 2018-12-13 DIAGNOSIS — R109 Unspecified abdominal pain: Secondary | ICD-10-CM | POA: Diagnosis present

## 2018-12-13 DIAGNOSIS — R197 Diarrhea, unspecified: Secondary | ICD-10-CM | POA: Insufficient documentation

## 2018-12-13 DIAGNOSIS — I129 Hypertensive chronic kidney disease with stage 1 through stage 4 chronic kidney disease, or unspecified chronic kidney disease: Secondary | ICD-10-CM | POA: Insufficient documentation

## 2018-12-13 DIAGNOSIS — R112 Nausea with vomiting, unspecified: Secondary | ICD-10-CM | POA: Diagnosis not present

## 2018-12-13 DIAGNOSIS — F17210 Nicotine dependence, cigarettes, uncomplicated: Secondary | ICD-10-CM | POA: Diagnosis not present

## 2018-12-13 DIAGNOSIS — Z79899 Other long term (current) drug therapy: Secondary | ICD-10-CM | POA: Diagnosis not present

## 2018-12-13 DIAGNOSIS — R1084 Generalized abdominal pain: Secondary | ICD-10-CM | POA: Diagnosis not present

## 2018-12-13 DIAGNOSIS — N183 Chronic kidney disease, stage 3 (moderate): Secondary | ICD-10-CM | POA: Diagnosis not present

## 2018-12-13 DIAGNOSIS — K449 Diaphragmatic hernia without obstruction or gangrene: Secondary | ICD-10-CM

## 2018-12-13 DIAGNOSIS — R69 Illness, unspecified: Secondary | ICD-10-CM

## 2018-12-13 LAB — COMPREHENSIVE METABOLIC PANEL
ALT: 10 U/L (ref 0–44)
AST: 21 U/L (ref 15–41)
Albumin: 3.9 g/dL (ref 3.5–5.0)
Alkaline Phosphatase: 90 U/L (ref 38–126)
Anion gap: 11 (ref 5–15)
BUN: 6 mg/dL (ref 6–20)
CO2: 25 mmol/L (ref 22–32)
Calcium: 8.8 mg/dL — ABNORMAL LOW (ref 8.9–10.3)
Chloride: 102 mmol/L (ref 98–111)
Creatinine, Ser: 0.82 mg/dL (ref 0.44–1.00)
GFR calc Af Amer: >60 mL/min (ref >60)
GFR calc non Af Amer: >60 mL/min (ref >60)
Glucose, Bld: 113 mg/dL — ABNORMAL HIGH (ref 70–99)
Potassium: 3.4 mmol/L — ABNORMAL LOW (ref 3.5–5.1)
Sodium: 138 mmol/L (ref 135–145)
Total Bilirubin: 0.4 mg/dL (ref 0.3–1.2)
Total Protein: 7 g/dL (ref 6.5–8.1)

## 2018-12-13 LAB — CBC
HCT: 39.7 % (ref 36.0–46.0)
Hemoglobin: 13.5 g/dL (ref 12.0–15.0)
MCH: 30.1 pg (ref 26.0–34.0)
MCHC: 34 g/dL (ref 30.0–36.0)
MCV: 88.4 fL (ref 80.0–100.0)
Platelets: 323 10*3/uL (ref 150–400)
RBC: 4.49 MIL/uL (ref 3.87–5.11)
RDW: 13.2 % (ref 11.5–15.5)
WBC: 11.9 10*3/uL — ABNORMAL HIGH (ref 4.0–10.5)
nRBC: 0 % (ref 0.0–0.2)

## 2018-12-13 LAB — LIPASE, BLOOD: Lipase: 28 U/L (ref 11–51)

## 2018-12-13 MED ORDER — FENTANYL CITRATE (PF) 100 MCG/2ML IJ SOLN
50.0000 ug | Freq: Once | INTRAMUSCULAR | Status: AC
  Administered 2018-12-13: 50 ug via INTRAVENOUS
  Filled 2018-12-13: qty 2

## 2018-12-13 MED ORDER — ONDANSETRON HCL 4 MG/2ML IJ SOLN
4.0000 mg | Freq: Once | INTRAMUSCULAR | Status: AC
  Administered 2018-12-13: 4 mg via INTRAVENOUS
  Filled 2018-12-13: qty 2

## 2018-12-13 MED ORDER — IOHEXOL 9 MG/ML PO SOLN
500.0000 mL | ORAL | Status: AC
  Administered 2018-12-13: 500 mL via ORAL

## 2018-12-13 MED ORDER — SODIUM CHLORIDE 0.9 % IV BOLUS
1000.0000 mL | Freq: Once | INTRAVENOUS | Status: AC
  Administered 2018-12-13: 1000 mL via INTRAVENOUS

## 2018-12-13 NOTE — ED Triage Notes (Signed)
Reports emesis and diarrhea after eating or drinking X 1 week. Blisters to bilateral hands. Pt alert and oriented X4, cooperative, RR even and unlabored, color WNL. Pt in NAD.

## 2018-12-13 NOTE — ED Notes (Signed)
Pt asked for urine by this RN. Pt states that she is unable to urinate at this time.

## 2018-12-13 NOTE — ED Provider Notes (Addendum)
[facility] Emergency Department Provider Note   ____________________________________________   First MD Initiated Contact with Patient 12/13/18 2325     (approximate)  I have reviewed the triage vital signs and the nursing notes.   HISTORY  Chief Complaint Abdominal Pain, Diarrhea, and Other (Blisters)    HPI Ann Pena is a 58 y.o. female who presents to the ED from home with a chief complaint of abdominal pain, nausea/vomiting/diarrhea.  Patient reports diarrhea x3 weeks; loose stools after eating or drinking.  Around the same time she developed blisters to bilateral palms.  Denies glove wearing, new exposures other than hand sanitizer which she states she does not apply overwhelmingly.  No skin changes anywhere else.  1 week ago she developed vomiting as well.  Denies fever, cough, chest pain, shortness of breath, dysuria.  Denies recent antibiotic, travel, trauma or exposure to persons diagnosed with coronavirus.       Past Medical History:  Diagnosis Date  . Anxiety state, unspecified   . Aortic calcification ([place]) 09/20/2017  . Asthma   . 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 Chest pain, unspecified   . Chronic kidney disease, stage III (moderate) (HCC)   . Depression   . GERD (gastroesophageal reflux disease) 09/16/2014  . H/O endoscopy 05/30/2012   Overview:  Dr. Chauncey Cruel. Iftikhar--LA Grade B reflux esophagitis. Hiatus hernia. Gastritis. Overview:  Dr. Chauncey Cruel. Iftikhar--LA Grade B reflux esophagitis. Hiatus hernia. Gastritis.  . Hypertension 03/18/2012  . Infective otitis externa, unspecified   . Murmur 10/17/2013  . Obesity, unspecified   . Other and unspecified hyperlipidemia   . Other nonspecific abnormal serum enzyme levels   . Pain in joint, lower leg   . Pain in limb   . Panic disorder without agoraphobia   . Posttraumatic stress disorder   . Prediabetes 09/20/2017  . Screening for malignant neoplasm of breast 04/04/2012  .  Shortness of breath   . Syncope and collapse     Patient Active Problem List   Diagnosis Date Noted  . Prediabetes 09/20/2017  . Aortic calcification ([place]) 09/20/2017  . Adiposity 06/15/2017  . Breath shortness 06/15/2017  . Gonalgia 06/15/2017  . Panic attack 06/15/2017  . Counseling on substance use and abuse 06/15/2017  . Chronic renal disease, stage II 06/15/2017  . Radicular pain in right arm 10/07/2015  . Chest pain, unspecified 08/25/2015  . Headache, post-traumatic, acute 05/26/2015  . Tinnitus, subjective 05/26/2015  . Anxiety 09/16/2014  . Chronic post-traumatic stress disorder 09/16/2014  . Abnormal serum level of alkaline phosphatase 09/16/2014  . GERD (gastroesophageal reflux disease) 09/16/2014  . 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 Pain in the chest 10/17/2013  . Murmur 10/17/2013  . Tobacco use 10/17/2013  . Dyslipidemia   . H/O endoscopy 05/30/2012  . Dry lips 04/17/2012  . Rash and nonspecific skin eruption 04/17/2012  . Screening for malignant neoplasm of breast 04/04/2012  . Warts 04/04/2012  . Diarrhea, unspecified 04/04/2012  . Airway hyperreactivity 03/18/2012  . Clinical depression 03/18/2012  . Hypertension 03/18/2012    Past Surgical History:  Procedure Laterality Date  . CHOLECYSTECTOMY N/A 10/15/2017   Procedure: LAPAROSCOPIC CHOLECYSTECTOMY;  Surgeon: Olean Ree, MD;  Location: [facility] ORS;  Service: General;  Laterality: N/A;  . SHOULDER SURGERY    . TOTAL ABDOMINAL HYSTERECTOMY      Prior to Admission medications   Medication Sig Start Date End Date Taking? Authorizing Provider  albuterol (VENTOLIN HFA) 108 (90 Base) MCG/ACT inhaler  Inhale 2 puffs into the lungs every 4 (four) hours as needed for wheezing or shortness of breath. 07/23/18   Lada, Satira Anis, MD  ALPRAZolam Duanne Moron) 1 MG tablet Take 0.5 mg by mouth 4 (four) times daily.  04/01/15   [provider]  citalopram (CELEXA) 40 MG tablet Take 40 mg by mouth daily.  08/07/13    [provider]  dicyclomine (BENTYL) 20 MG tablet Take 1 tablet (20 mg total) by mouth every 6 (six) hours as needed. 12/14/18   Paulette Blanch, MD  ezetimibe (ZETIA) 10 MG tablet Take 1 tablet (10 mg total) by mouth daily. 07/24/18   Wellington Hampshire, MD  famotidine (PEPCID) 20 MG tablet Take 1 tablet (20 mg total) by mouth 2 (two) times daily. 12/14/18   Paulette Blanch, MD  ibuprofen (ADVIL,MOTRIN) 600 MG tablet Take 1 tablet (600 mg total) by mouth every 8 (eight) hours as needed. 10/15/17   Olean Ree, MD  mirtazapine (REMERON) 45 MG tablet Take 45 mg by mouth at bedtime.     [provider]  omeprazole (PRILOSEC) 20 MG capsule Take 1 capsule (20 mg total) by mouth every other day. 06/05/18   Arnetha Courser, MD  ondansetron (ZOFRAN ODT) 4 MG disintegrating tablet Take 1 tablet (4 mg total) by mouth every 8 (eight) hours as needed for nausea or vomiting. 12/14/18   Paulette Blanch, MD  rosuvastatin (CRESTOR) 5 MG tablet Take 1 tablet (5 mg total) by mouth daily. 07/24/18 10/22/18  Wellington Hampshire, MD    Allergies Yellow jacket venom [bee venom], Penicillins, and Sulfa antibiotics  Family History  Problem Relation Age of Onset  . Hypertension Mother   . COPD Mother   . Heart disease Mother   . Stroke Mother   . Dementia Mother   . Hypertension Father   . Bone cancer Father   . Heart attack Father   . Heart disease Father   . Thyroid disease Sister     Social History Social History   Tobacco Use  . Smoking status: Current Every Day Smoker    Packs/day: 1.00    Years: 42.00    Pack years: 42.00    Types: Cigarettes  . Smokeless tobacco: Never Used  Substance Use Topics  . Alcohol use: No  . Drug use: No    Review of Systems  Constitutional: No fever/chills Eyes: No visual changes. ENT: No sore throat. Cardiovascular: Denies chest pain. Respiratory: Denies shortness of breath. Gastrointestinal: Positive for abdominal pain, nausea, vomiting and diarrhea.  No  constipation. Genitourinary: Negative for dysuria. Musculoskeletal: Negative for back pain. Skin: Positive for blisters to bilateral palms.  Negative for rash. Neurological: Negative for headaches, focal weakness or numbness.   ____________________________________________   PHYSICAL EXAM:  VITAL SIGNS: ED Triage Vitals  Enc Vitals Group     BP 12/13/18 1648 (!) 116/96     Pulse Rate 12/13/18 1648 84     Resp 12/13/18 1648 18     Temp 12/13/18 1648 98.4 F (36.9 C)     Temp Source 12/13/18 1648 Oral     SpO2 12/13/18 1648 96 %     Weight 12/13/18 1649 140 lb (63.5 kg)     Height 12/13/18 1649 _0  (1.651 m)     Head Circumference --      Peak Flow --      Pain Score 12/13/18 1648 4     Pain Loc --  Pain Edu? --      Excl. in [place]? --     Constitutional: Alert and oriented. Well appearing and in mild acute distress. Eyes: Conjunctivae are normal. PERRL. EOMI. Head: Atraumatic. Nose: No congestion/rhinnorhea. Mouth/Throat: Mucous membranes are moist.  Oropharynx non-erythematous. Neck: No stridor.   Cardiovascular: Normal rate, regular rhythm. Grossly normal heart sounds.  Good peripheral circulation. Respiratory: Normal respiratory effort.  No retractions. Lungs CTAB. Gastrointestinal: Soft with mild diffuse tenderness to palpation without rebound or guarding.  Mild distention. No abdominal bruits. No CVA tenderness. Musculoskeletal: No lower extremity tenderness nor edema.  No joint effusions. Neurologic:  Normal speech and language. No gross focal neurologic deficits are appreciated. No gait instability. Skin:  Skin is warm, dry and intact.  Tiny raised blisters noted to bilateral palms which are itchy. Psychiatric: Mood and affect are normal. Speech and behavior are normal.  ____________________________________________   LABS (all labs ordered are listed, but only abnormal results are displayed)  Labs Reviewed  COMPREHENSIVE METABOLIC PANEL - Abnormal; Notable  for the following components:      Result Value   Potassium 3.4 (*)    Glucose, Bld 113 (*)    Calcium 8.8 (*)    All other components within normal limits  CBC - Abnormal; Notable for the following components:   WBC 11.9 (*)    All other components within normal limits  URINALYSIS, COMPLETE (UACMP) WITH MICROSCOPIC - Abnormal; Notable for the following components:   Color, Urine YELLOW (*)    APPearance CLEAR (*)    Leukocytes,Ua TRACE (*)    All other components within normal limits  C DIFFICILE QUICK SCREEN W PCR REFLEX  GASTROINTESTINAL PANEL BY PCR, STOOL (REPLACES STOOL CULTURE)  LIPASE, BLOOD   ____________________________________________  EKG  ED ECG REPORT I, Birdena Kingma J, the attending physician, personally viewed and interpreted this ECG.   Date: 12/22/2018  EKG Time: 1658  Rate: 74  Rhythm: normal EKG, normal sinus rhythm  Axis: Normal  Intervals:none  ST&T Change: Nonspecific  ____________________________________________  RADIOLOGY  ED MD interpretation: Unremarkable CT other than large hiatal hernia  Official radiology report(s): Ct Abdomen Pelvis W Contrast  Result Date: 12/14/2018 CLINICAL DATA:  Nausea, vomiting and abdominal pain EXAM: CT ABDOMEN AND PELVIS WITH CONTRAST TECHNIQUE: Multidetector CT imaging of the abdomen and pelvis was performed using the standard protocol following bolus administration of intravenous contrast. CONTRAST:  166m OMNIPAQUE IOHEXOL 300 MG/ML  SOLN COMPARISON:  None. FINDINGS: LOWER CHEST: There is no basilar pleural or apical pericardial effusion. HEPATOBILIARY: The hepatic contours and density are normal. There is no intra- or extrahepatic biliary dilatation. Status post cholecystectomy. PANCREAS: The pancreatic parenchymal contours are normal and there is no ductal dilatation. There is no peripancreatic fluid collection. SPLEEN: Normal. ADRENALS/URINARY TRACT: --Adrenal glands: Normal. --Right kidney/ureter: No  hydronephrosis, nephroureterolithiasis, perinephric stranding or solid renal mass. --Left kidney/ureter: No hydronephrosis, nephroureterolithiasis, perinephric stranding or solid renal mass. --Urinary bladder: Normal for degree of distention STOMACH/BOWEL: --Stomach/Duodenum: There is a large hiatal hernia. There is no other gastric abnormality. The duodenal course and caliber are normal. --Small bowel: No dilatation or inflammation. --Colon: No focal abnormality. --Appendix: Normal. VASCULAR/LYMPHATIC: There is aortic atherosclerosis without hemodynamically significant stenosis. No abdominal or pelvic lymphadenopathy. REPRODUCTIVE: Status post hysterectomy. No adnexal mass. MUSCULOSKELETAL. No bony spinal canal stenosis or focal osseous abnormality. OTHER: None. IMPRESSION: 1. No acute abdominal or pelvic abnormality. 2. Large hiatal hernia, which could contribute to nausea. 3. Aortic atherosclerosis (ICD10-I70.0). Electronically Signed  By: Ulyses Jarred M.D.   On: 12/14/2018 01:32    ____________________________________________   PROCEDURES  Procedure(s) performed (including Critical Care):  Procedures   ____________________________________________   INITIAL IMPRESSION / ASSESSMENT AND PLAN / ED COURSE  As part of my medical decision making, I reviewed the following data within the [place] notes reviewed and incorporated, Labs reviewed, Old chart reviewed, Radiograph reviewed and Notes from prior ED visits     ISHANVI MCQUITTY was evaluated in Emergency Department on 12/14/2018 for the symptoms described in the history of present illness. She was evaluated in the context of the global COVID-19 pandemic, which necessitated consideration that the patient might be at risk for infection with the SARS-CoV-2 virus that causes COVID-19. Institutional protocols and algorithms that pertain to the evaluation of patients at risk for COVID-19 are in a state of rapid change  based on information released by regulatory bodies including the CDC and federal and state organizations. These policies and algorithms were followed during the patient's care in the ED.    58 year old female who presents with a 3-week history of diarrhea, 1 week history of vomiting, abdominal discomfort and bilateral palm rash. Differential diagnosis includes, but is not limited to, ovarian cyst, ovarian torsion, acute appendicitis, diverticulitis, urinary tract infection/pyelonephritis, endometriosis, bowel obstruction, colitis, renal colic, gastroenteritis, hernia, fibroids, endometriosis, etc.  Laboratory results unremarkable.  Awaiting urinalysis.  Will check C. difficile and stool cultures, CT abdomen/pelvis.  Initiate IV fluid resuscitation, 50 mcg IV fentanyl for pain paired with 4 mg IV Zofran for nausea.   Clinical Course as of Dec 13 457  Sat Dec 14, 2018  0234 Unremarkable CT scan other than large hiatal hernia.  Updated patient.  Will administer fosfomycin for trace leukocytes in urine.  Will send home with stool collection kit.  Advised moisturizing palms.  Rash is 3 weeks old without signs of petechiae.  Strict return precautions given.  Patient verbalizes understanding agrees with plan of care.   [JS]    Clinical Course User Index [JS] Paulette Blanch, MD     ____________________________________________   FINAL CLINICAL IMPRESSION(S) / ED DIAGNOSES  Final diagnoses:  Generalized abdominal pain  Nausea vomiting and diarrhea  Hiatal hernia     ED Discharge Orders         Ordered    famotidine (PEPCID) 20 MG tablet  2 times daily     12/14/18 0237    ondansetron (ZOFRAN ODT) 4 MG disintegrating tablet  Every 8 hours PRN     12/14/18 0239    dicyclomine (BENTYL) 20 MG tablet  Every 6 hours PRN     12/14/18 0239           Note:  This document was prepared using Dragon voice recognition software and may include unintentional dictation errors.   Paulette Blanch, MD  12/14/18 5400    Paulette Blanch, MD 12/22/18 [id number]

## 2018-12-13 NOTE — Progress Notes (Signed)
Pt was sent to the ER by nursing staff due to concerning sx of weakness, fatigue, near syncope, weight loss, N/V/D abdominal pain.  Agree with plan to r/o AKI/sepsis, to do PE and determine need for imaging, to rehydrate with IVF  No assessment done by me today

## 2018-12-14 ENCOUNTER — Emergency Department: Payer: 59

## 2018-12-14 DIAGNOSIS — R1084 Generalized abdominal pain: Secondary | ICD-10-CM | POA: Diagnosis not present

## 2018-12-14 LAB — URINALYSIS, COMPLETE (UACMP) WITH MICROSCOPIC
Bacteria, UA: NONE SEEN
Bilirubin Urine: NEGATIVE
Glucose, UA: NEGATIVE mg/dL
Hgb urine dipstick: NEGATIVE
Ketones, ur: NEGATIVE mg/dL
Nitrite: NEGATIVE
Protein, ur: NEGATIVE mg/dL
Specific Gravity, Urine: 1.006 (ref 1.005–1.030)
pH: 7 (ref 5.0–8.0)

## 2018-12-14 MED ORDER — DICYCLOMINE HCL 20 MG PO TABS: 20.0000 mg | ORAL_TABLET | Freq: Four times a day (QID) | ORAL | 0 refills | Status: DC | PRN

## 2018-12-14 MED ORDER — FAMOTIDINE 20 MG PO TABS: 20.0000 mg | ORAL_TABLET | Freq: Two times a day (BID) | ORAL | 0 refills | Status: DC

## 2018-12-14 MED ORDER — FOSFOMYCIN TROMETHAMINE 3 G PO PACK
3.0000 g | PACK | Freq: Once | ORAL | Status: AC
Start: 2018-12-14 — End: 2018-12-14
  Administered 2018-12-14: 03:00:00 3 g via ORAL
  Filled 2018-12-14: qty 3

## 2018-12-14 MED ORDER — ONDANSETRON 4 MG PO TBDP: 4.0000 mg | ORAL_TABLET | Freq: Three times a day (TID) | ORAL | 0 refills | Status: DC | PRN

## 2018-12-14 MED ORDER — IOHEXOL 300 MG/ML  SOLN
100.0000 mL | Freq: Once | INTRAMUSCULAR | Status: AC | PRN
  Administered 2018-12-14: 100 mL via INTRAVENOUS

## 2018-12-14 NOTE — ED Notes (Signed)
Pt to CT at this time.

## 2018-12-14 NOTE — Discharge Instructions (Signed)
1.  Start Pepcid 20 mg twice daily. 2.  You may take Bentyl and Zofran as needed for abdominal discomfort and nausea. 3.  Clear liquids x12 hours, then [employer] x3 days, then slowly advance diet as tolerated. 4.  Return to the ER for worsening symptoms, persistent vomiting, difficulty breathing or other concerns.

## 2019-01-03 ENCOUNTER — Telehealth: Payer: Self-pay | Admitting: *Deleted

## 2019-01-03 ENCOUNTER — Encounter: Payer: Self-pay | Admitting: *Deleted

## 2019-01-03 DIAGNOSIS — Z87891 Personal history of nicotine dependence: Secondary | ICD-10-CM

## 2019-01-03 DIAGNOSIS — Z122 Encounter for screening for malignant neoplasm of respiratory organs: Secondary | ICD-10-CM

## 2019-01-03 NOTE — Telephone Encounter (Signed)
Patient has been notified that annual lung cancer screening low dose CT scan is due currently or will be in near future. Confirmed that patient is within the age range of 55-77, and asymptomatic, (no signs or symptoms of lung cancer). Patient denies illness that would prevent curative treatment for lung cancer if found. Verified smoking history, (current, 43 pack year). The shared decision making visit was done 10/05/17. Patient is agreeable for CT scan being scheduled.

## 2019-01-08 ENCOUNTER — Ambulatory Visit: Admission: RE | Admit: 2019-01-08 | Payer: 59 | Source: Ambulatory Visit

## 2019-01-30 ENCOUNTER — Ambulatory Visit: Admission: RE | Admit: 2019-01-30 | Payer: 59 | Source: Ambulatory Visit

## 2019-01-31 ENCOUNTER — Ambulatory Visit
Admission: RE | Admit: 2019-01-31 | Discharge: 2019-01-31 | Disposition: A | Discharge status: Home / Self Care | Payer: 59 | Source: Ambulatory Visit | Attending: Nurse Practitioner | Admitting: Nurse Practitioner

## 2019-01-31 ENCOUNTER — Other Ambulatory Visit: Payer: Self-pay

## 2019-01-31 DIAGNOSIS — Z122 Encounter for screening for malignant neoplasm of respiratory organs: Secondary | ICD-10-CM | POA: Insufficient documentation

## 2019-01-31 DIAGNOSIS — Z87891 Personal history of nicotine dependence: Secondary | ICD-10-CM

## 2019-02-04 ENCOUNTER — Encounter: Payer: Self-pay | Admitting: *Deleted

## 2019-03-06 IMAGING — US US ABDOMEN LIMITED
1 series · 14 of 25 positions shown · non-contrast
Comparison: 08/06/2008

CLINICAL DATA: Right upper quadrant pain

EXAM:
ULTRASOUND ABDOMEN LIMITED RIGHT UPPER QUADRANT

[Series 1: us abdomen limited · 0.17mm/px · 14 of 54 slices shown]
[im 1/54]
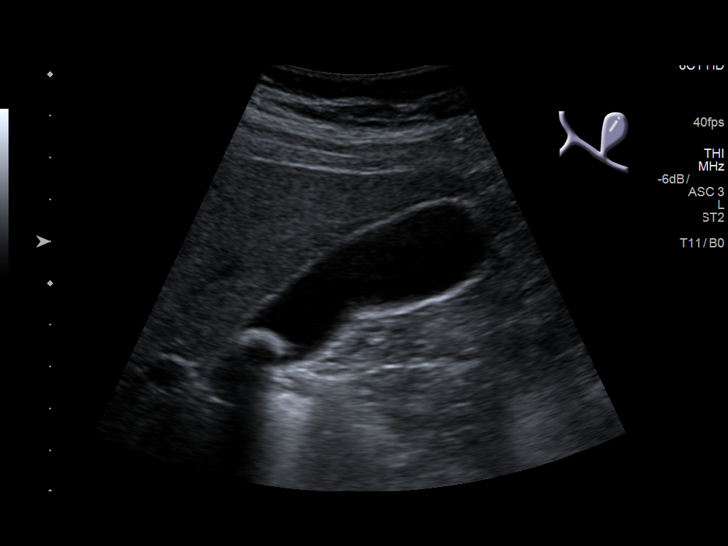
[im 5/54]
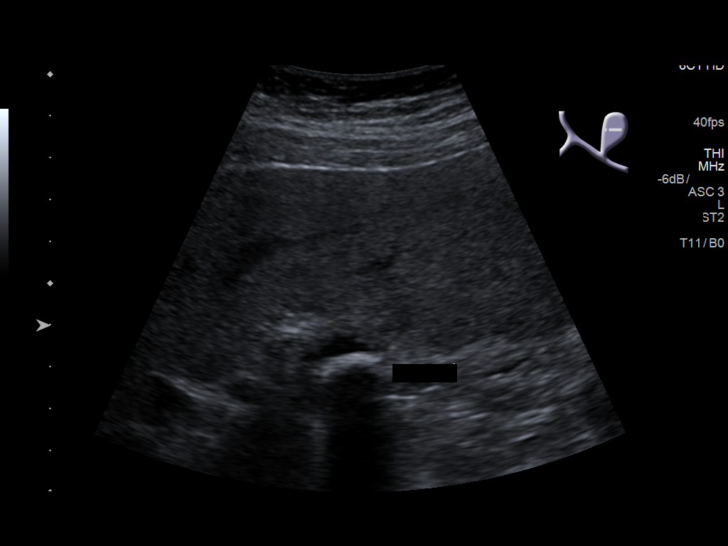
[im 9/54]
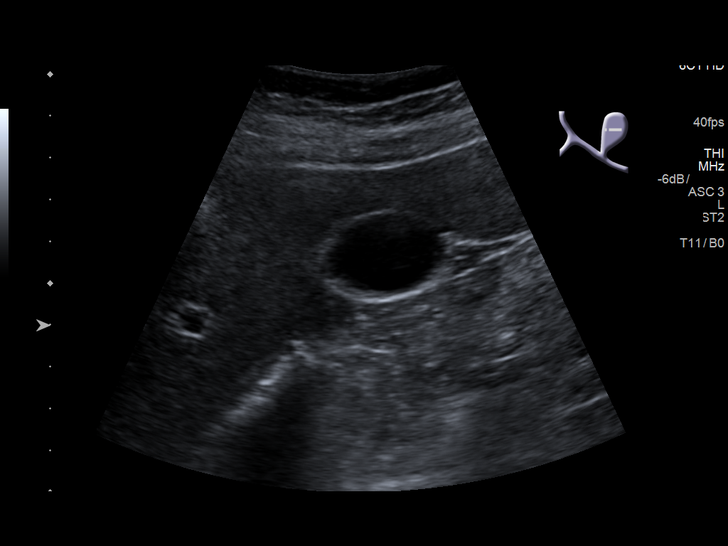
[im 14/54]
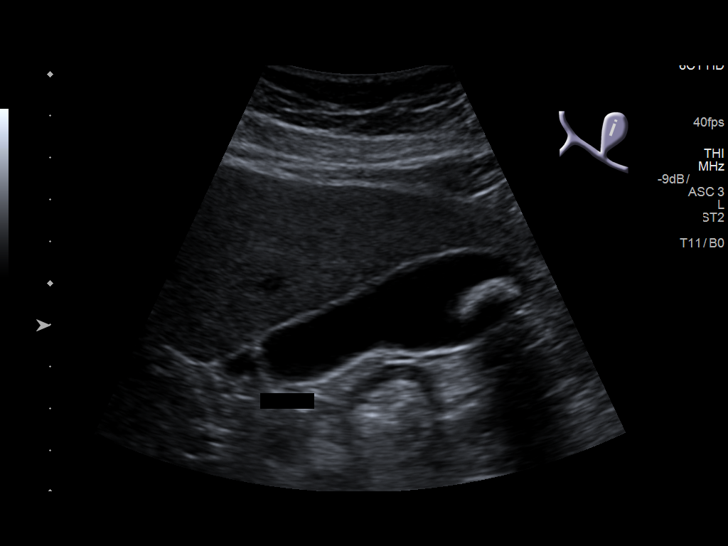
[im 18/54]
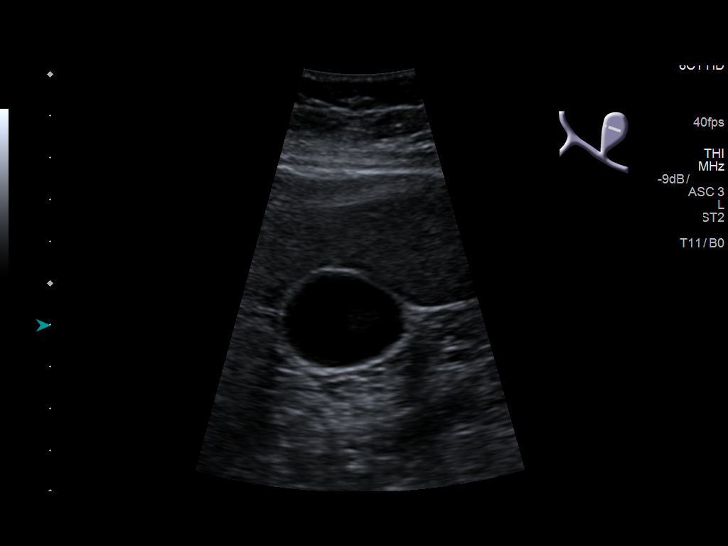
[im 20/54]
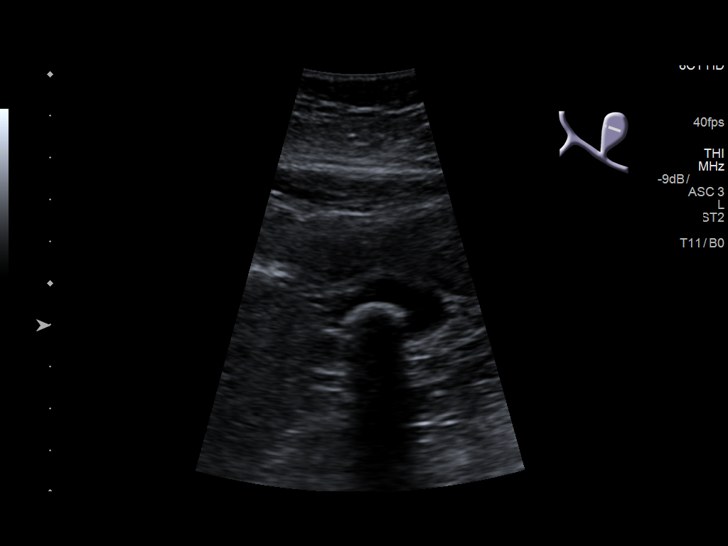
[im 25/54]
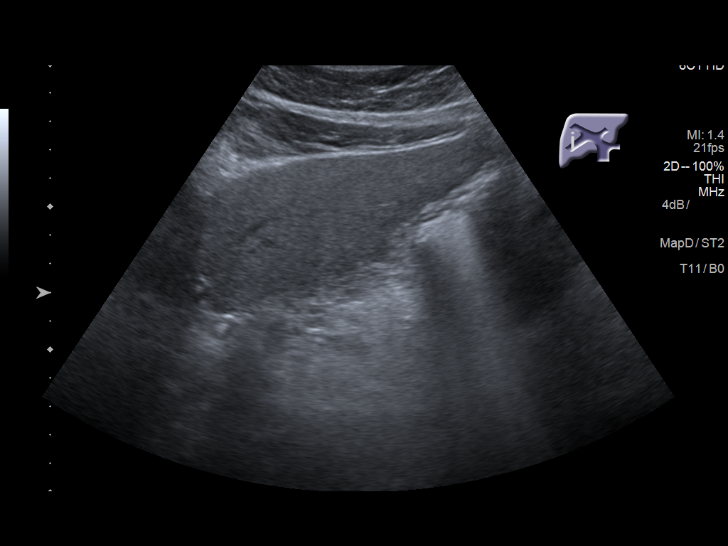
[im 29/54]
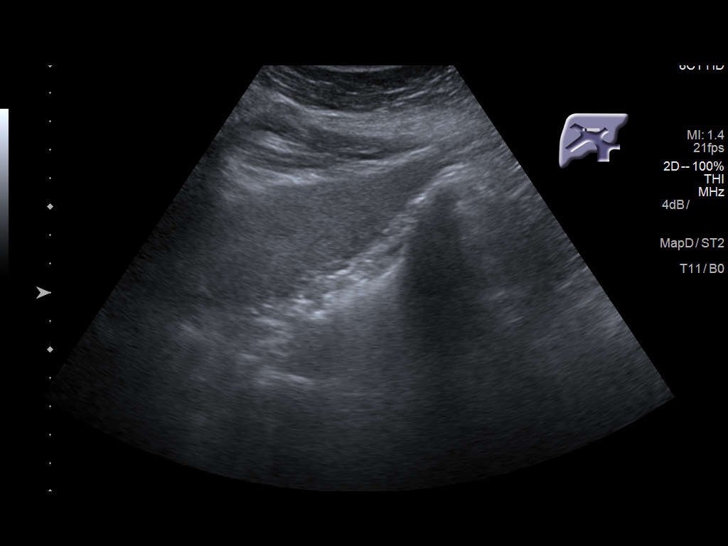
[im 34/54]
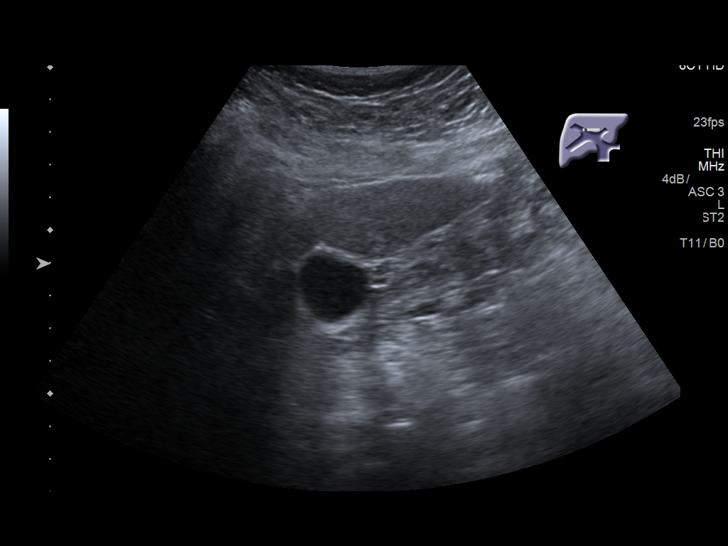
[im 36/54]
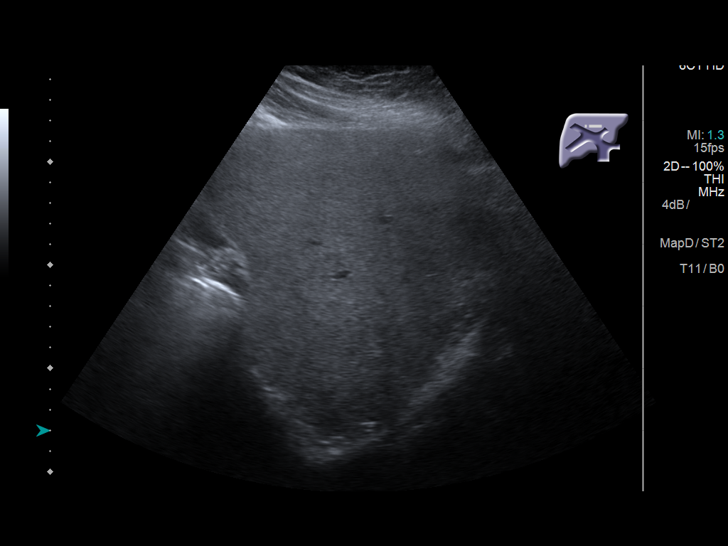
[im 40/54]
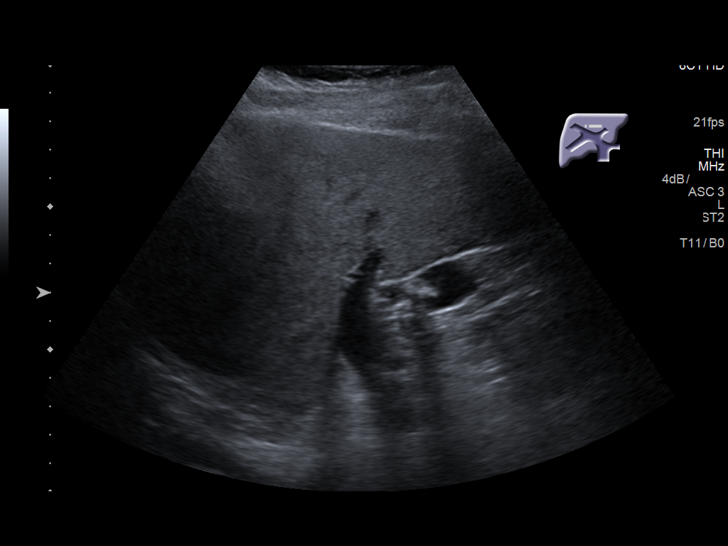
[im 45/54]
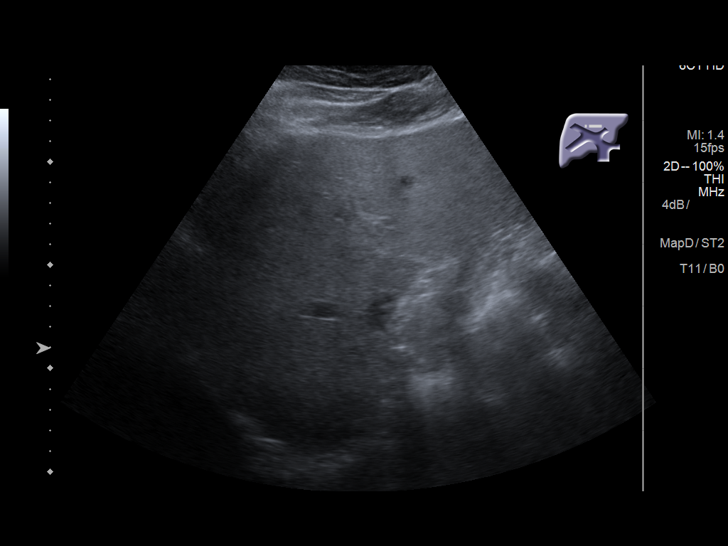
[im 49/54]
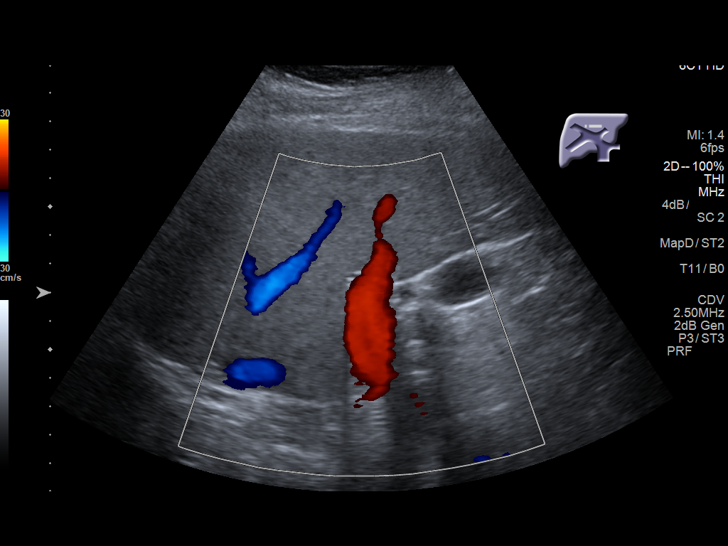
[im 54/54]
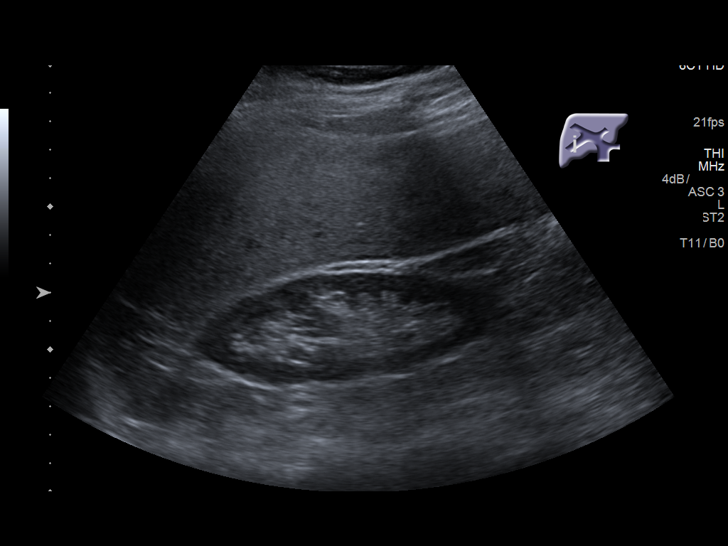

[14 of 25 positions shown; findings below may reference images not displayed]

FINDINGS: Gallbladder:

1.5 cm gallstone is present.  No wall thickening or Murphy's sign.

Common bile duct:

Diameter: 4 mm.

Liver:

No focal lesion identified. Within normal limits in parenchymal
echogenicity. Portal vein is patent on color Doppler imaging with
normal direction of blood flow towards the liver.
IMPRESSION: Cholelithiasis.  Normal liver and biliary tree.

## 2019-10-14 IMAGING — CT CT CHEST LUNG CANCER SCREENING LOW DOSE W/O CM
2 of 5 series · 15 of 40 positions shown, 18 images · non-contrast
Comparison: None.

CLINICAL DATA: 57-year-old female with 42 pack-year history of
smoking. Lung cancer screening.

EXAM:
CT CHEST WITHOUT CONTRAST LOW-DOSE FOR LUNG CANCER SCREENING
TECHNIQUE: Multidetector CT imaging of the chest was performed following the
standard protocol without IV contrast.

[Series 3: lung · axial · 0.58mm/px · z∈[-1297,-1002]mm · 12 of 325 slices shown, 15 images (1 of 2)]
[im 15/325  mediastinal]
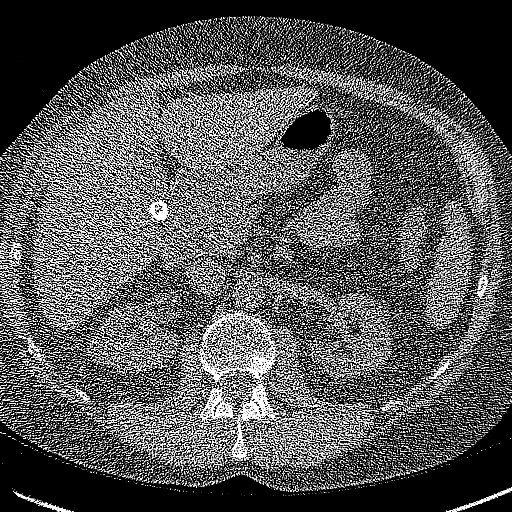
[im 15/325  lung]
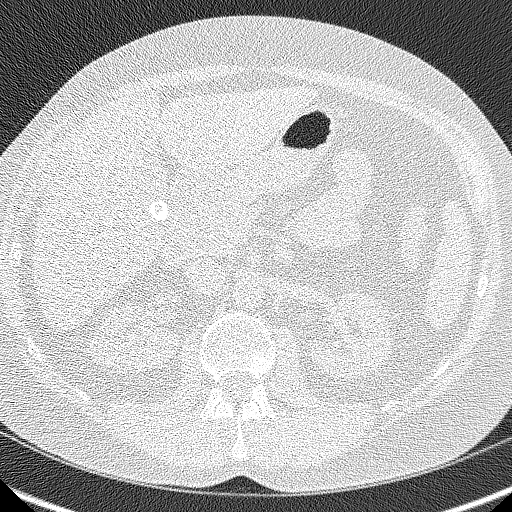
[im 45/325  lung]
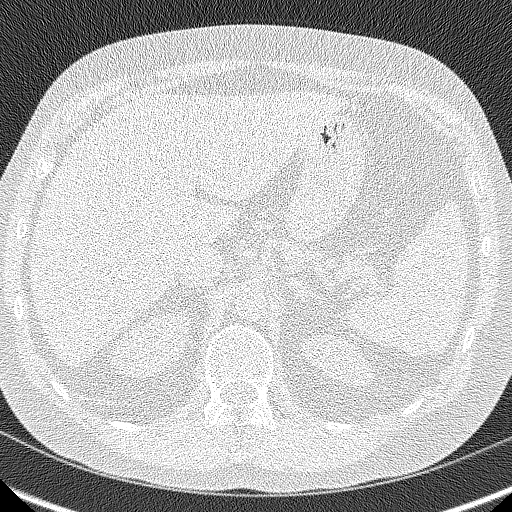
[im 74/325  lung]
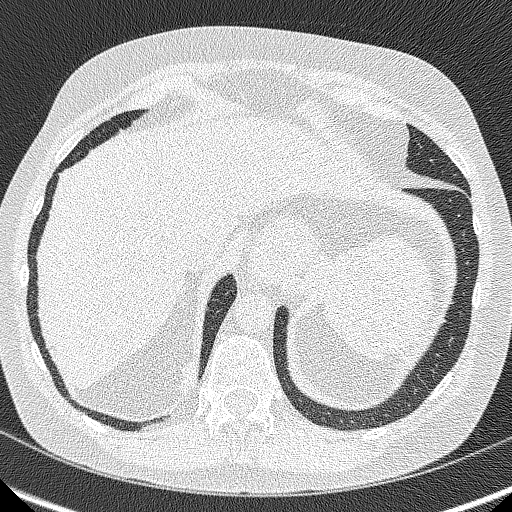
[im 104/325  lung]
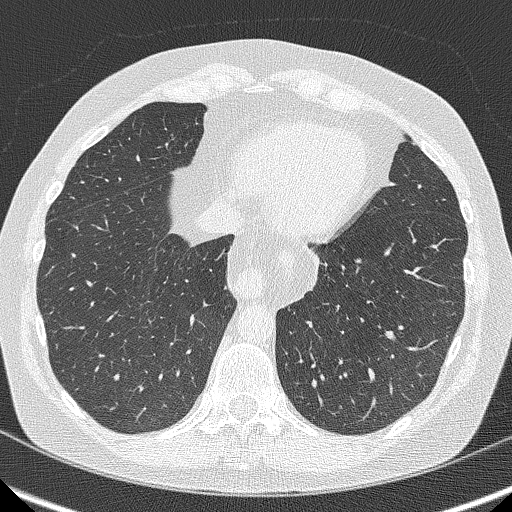
[im 118/325  mediastinal]
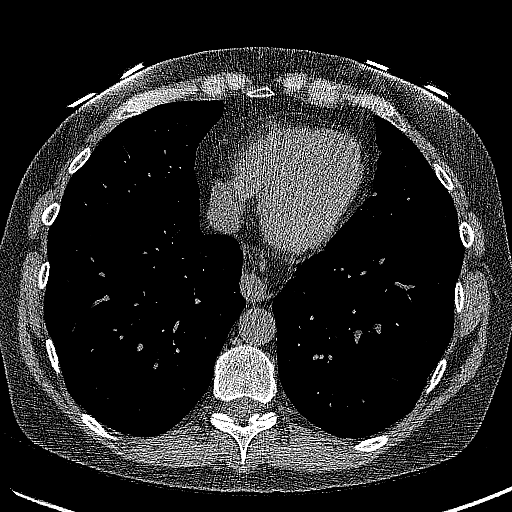
[im 118/325  lung]
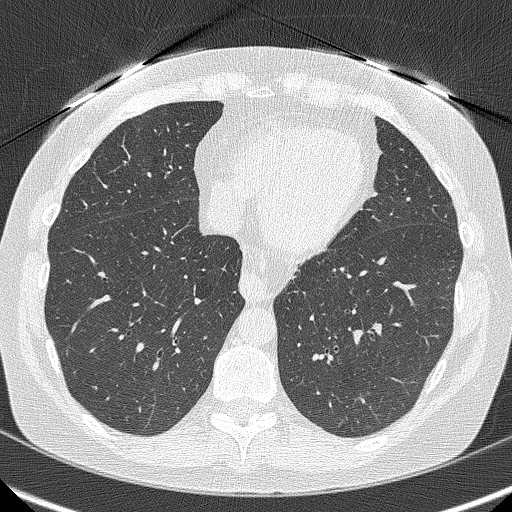
[im 148/325  lung]
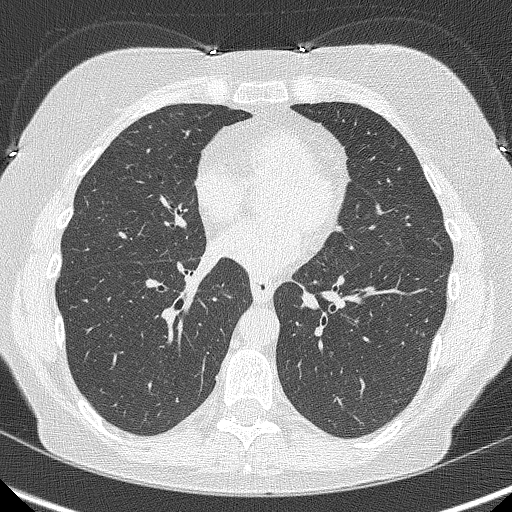
[im 177/325  lung]
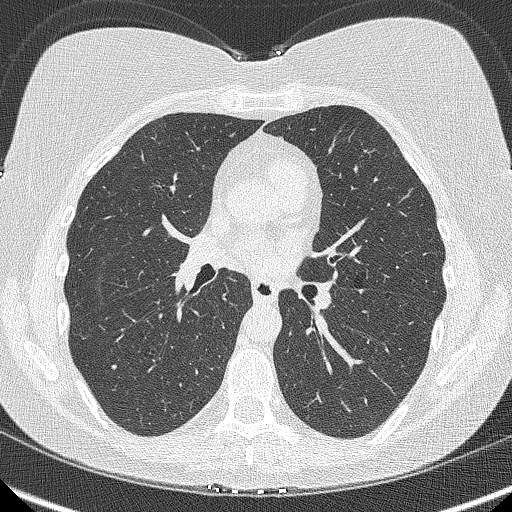
[im 207/325  lung]
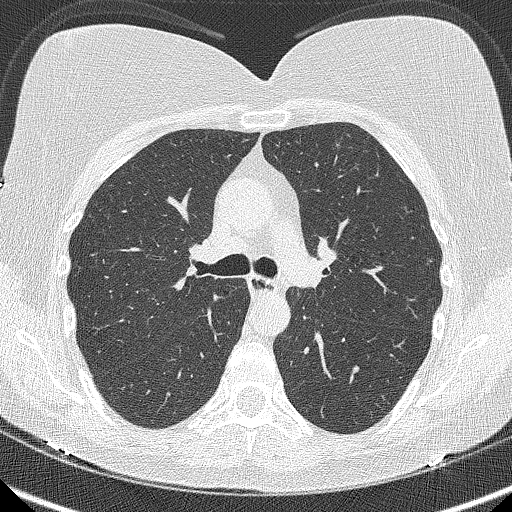
[im 221/325  mediastinal]
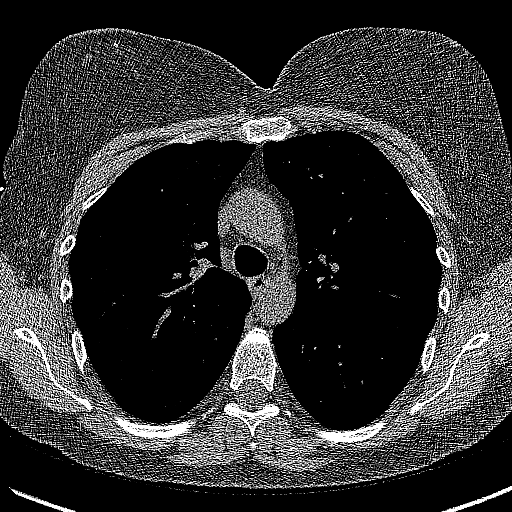
[im 221/325  lung]
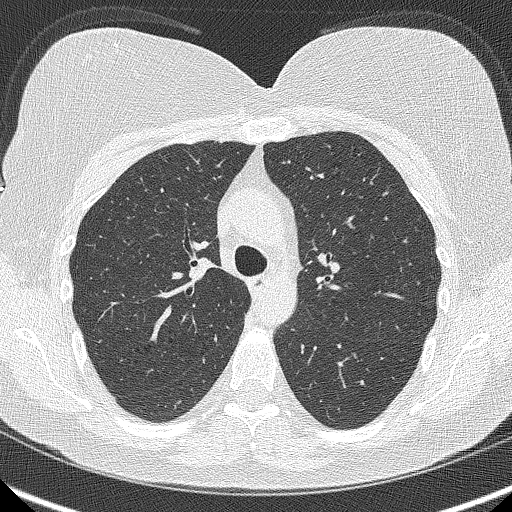
[im 251/325  lung]
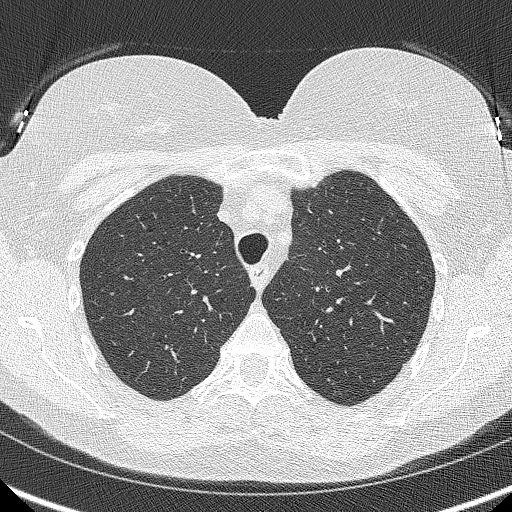
[im 280/325  lung]
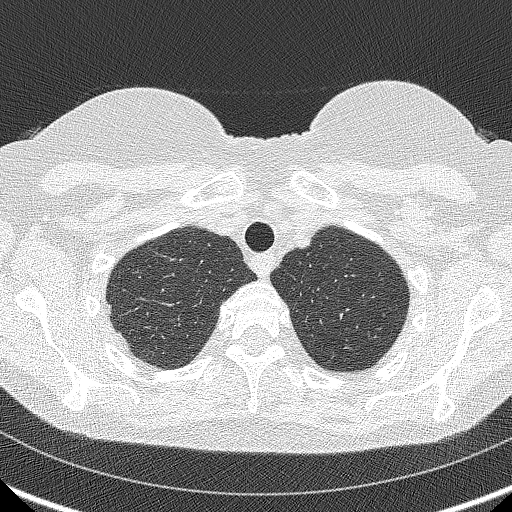
[im 310/325  lung]
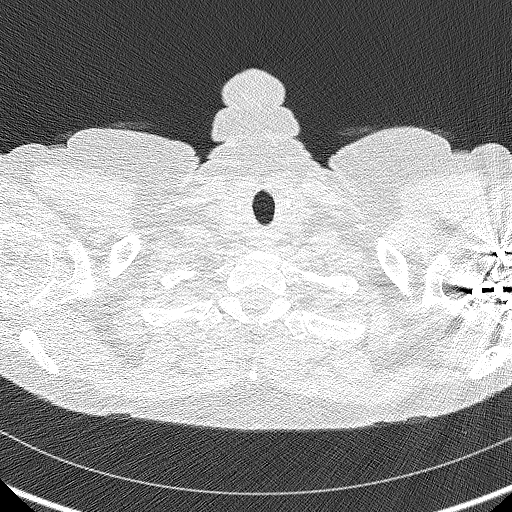

[Series 4: lung · coronal · 0.58mm/px · 3 of 291 slices shown (2 of 2)]
[im 59/291  lung]
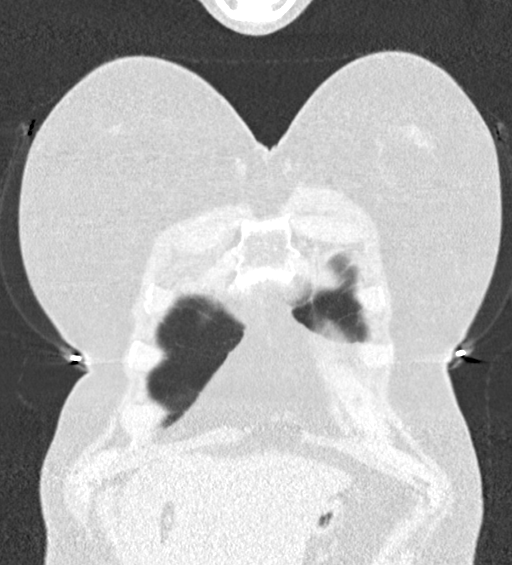
[im 117/291  lung]
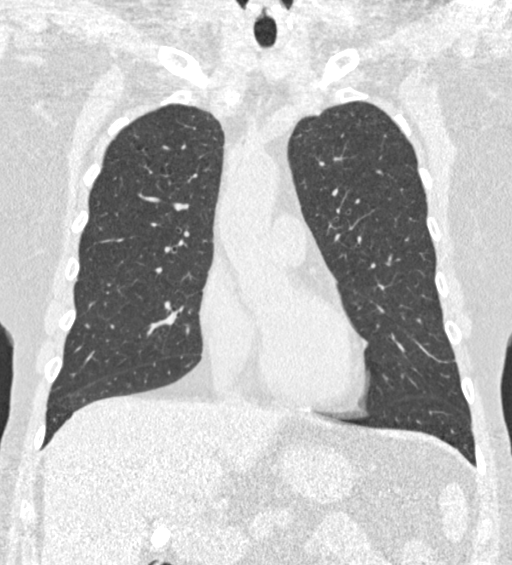
[im 175/291  lung]
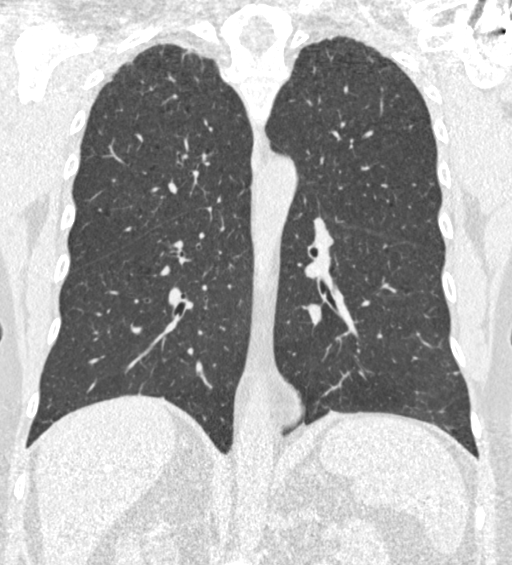

[15 of 40 positions shown; findings below may reference images not displayed]

FINDINGS: Cardiovascular: The heart size is normal. No substantial pericardial
effusion. Coronary artery calcification is evident. No thoracic
aortic aneurysm.

Mediastinum/Nodes: No mediastinal lymphadenopathy. There is no hilar
lymphadenopathy. The esophagus has normal imaging features. Small
hiatal hernia noted. There is no axillary lymphadenopathy.

Lungs/Pleura: Biapical pleuroparenchymal scarring evident.
Centrilobular emphysema noted bilaterally. Tiny bilateral pulmonary
nodules are identified, measuring up to maximum volume derived
equivalent diameter of 3.3 mm.

Upper Abdomen: 13 mm calcified gallstone without biliary dilatation.
Visualized upper abdomen otherwise unremarkable.

Musculoskeletal: No worrisome lytic or sclerotic osseous
abnormality.
IMPRESSION: 1. Lung-RADS 2, benign appearance or behavior. Continue annual
screening with low-dose chest CT without contrast in 12 months.
2.  Emphysema. ([id number])
3. Cholelithiasis.

## 2020-02-01 ENCOUNTER — Telehealth: Payer: Self-pay | Admitting: *Deleted

## 2020-02-01 NOTE — Telephone Encounter (Signed)
Attempted to leave a voicemail to inform patient that she is due for her lung cancer screening scan. Was unable to leave a message since her voicemail is not currently working.

## 2020-02-02 ENCOUNTER — Telehealth: Payer: Self-pay | Admitting: *Deleted

## 2020-02-02 NOTE — Telephone Encounter (Signed)
Attempted to reach patient to inform patient that she is due for her lung cancer screening scan. Unable to reach the patient. "Call could not be completed at this time, please try again." Will also send patient a mychart message in attempts to reach this patient.

## 2020-02-05 ENCOUNTER — Telehealth: Payer: Self-pay | Admitting: *Deleted

## 2020-02-05 ENCOUNTER — Encounter: Payer: Self-pay | Admitting: *Deleted

## 2020-02-05 NOTE — Telephone Encounter (Signed)
Attempted to contact patient by phone without answer or voicemail option. Will mail notification.

## 2020-02-05 NOTE — Telephone Encounter (Signed)
Attempted to reach patient to set up her lung CT screening. Unable to leave vm. "mail box invalid"

## 2020-02-12 ENCOUNTER — Telehealth: Payer: Self-pay

## 2020-02-12 DIAGNOSIS — Z87891 Personal history of nicotine dependence: Secondary | ICD-10-CM

## 2020-02-12 DIAGNOSIS — Z122 Encounter for screening for malignant neoplasm of respiratory organs: Secondary | ICD-10-CM

## 2020-02-12 NOTE — Telephone Encounter (Signed)
Attempted to contact patient to schedule lung screening scan but unable to reach her and unable to leave message. Attempted to cal emergency contact, Rinaldo Cloud, who answered and hung up.

## 2020-02-13 ENCOUNTER — Telehealth: Payer: Self-pay

## 2020-02-13 ENCOUNTER — Telehealth: Payer: Self-pay | Admitting: Cardiovascular Disease

## 2020-02-13 NOTE — Telephone Encounter (Signed)
Error

## 2020-02-13 NOTE — Telephone Encounter (Signed)
Called patient again today and scheduled her for CT lung screening scan on 03/04/20 at 2pm at [facility]. Verified with patient smoking status. She is a current smoker and is smoking about 1 pack per day currently and has been a smoker for approximately 42 years. No major changes in medical condition since last year. No changes in insurance since last year, she still has [employer].

## 2020-02-13 NOTE — Telephone Encounter (Signed)
Patient has been contacted and stated she has chosen to leave [employer] for care. Recall has been deleted

## 2020-02-16 NOTE — Addendum Note (Signed)
Addended by: Jonne Ply on: 02/16/2020 09:12 AM   Modules accepted: Orders

## 2020-02-16 NOTE — Telephone Encounter (Signed)
Current smoker, 44 pack year history.

## 2020-03-04 ENCOUNTER — Other Ambulatory Visit: Payer: Self-pay

## 2020-03-04 ENCOUNTER — Ambulatory Visit: Admission: RE | Admit: 2020-03-04 | Payer: 59 | Source: Ambulatory Visit

## 2020-03-16 ENCOUNTER — Telehealth: Payer: Self-pay | Admitting: *Deleted

## 2020-03-16 ENCOUNTER — Encounter: Payer: Self-pay | Admitting: *Deleted

## 2020-03-16 NOTE — Telephone Encounter (Signed)
Attempted to contact to reschedule lung screening scan. There is no answer or voicemail option. Will send mychart message.

## 2020-03-18 ENCOUNTER — Inpatient Hospital Stay: Admission: RE | Admit: 2020-03-18 | Payer: 59 | Source: Ambulatory Visit

## 2020-04-05 ENCOUNTER — Telehealth: Payer: Self-pay | Admitting: *Deleted

## 2020-04-05 NOTE — Telephone Encounter (Signed)
Contacted patient in attempt to schedule lung screening scan for this year. Patient reports she has new doctor and has already had her lung screening scan with her new doctor and will be followed in the future there. Patient is aware to contact me for any needs in the future.

## 2021-08-26 ENCOUNTER — Encounter: Payer: Self-pay | Admitting: Intensive Care

## 2021-08-26 ENCOUNTER — Other Ambulatory Visit: Payer: Self-pay

## 2021-08-26 ENCOUNTER — Emergency Department
Admission: EM | Admit: 2021-08-26 | Discharge: 2021-08-26 | Disposition: A | Discharge status: Home / Self Care | Payer: 59 | Attending: Emergency Medicine | Admitting: Emergency Medicine

## 2021-08-26 DIAGNOSIS — R531 Weakness: Secondary | ICD-10-CM | POA: Insufficient documentation

## 2021-08-26 DIAGNOSIS — R5383 Other fatigue: Secondary | ICD-10-CM | POA: Insufficient documentation

## 2021-08-26 DIAGNOSIS — R197 Diarrhea, unspecified: Secondary | ICD-10-CM | POA: Insufficient documentation

## 2021-08-26 DIAGNOSIS — R61 Generalized hyperhidrosis: Secondary | ICD-10-CM | POA: Insufficient documentation

## 2021-08-26 DIAGNOSIS — R002 Palpitations: Secondary | ICD-10-CM | POA: Diagnosis not present

## 2021-08-26 DIAGNOSIS — I10 Essential (primary) hypertension: Secondary | ICD-10-CM | POA: Insufficient documentation

## 2021-08-26 LAB — CBC
HCT: 41.9 % (ref 36.0–46.0)
Hemoglobin: 14.1 g/dL (ref 12.0–15.0)
MCH: 29 pg (ref 26.0–34.0)
MCHC: 33.7 g/dL (ref 30.0–36.0)
MCV: 86.2 fL (ref 80.0–100.0)
Platelets: 298 10*3/uL (ref 150–400)
RBC: 4.86 MIL/uL (ref 3.87–5.11)
RDW: 13.5 % (ref 11.5–15.5)
WBC: 6.7 10*3/uL (ref 4.0–10.5)
nRBC: 0 % (ref 0.0–0.2)

## 2021-08-26 LAB — URINALYSIS, ROUTINE W REFLEX MICROSCOPIC
Bacteria, UA: NONE SEEN
Bilirubin Urine: NEGATIVE
Glucose, UA: NEGATIVE mg/dL
Hgb urine dipstick: NEGATIVE
Ketones, ur: 5 mg/dL — AB
Nitrite: NEGATIVE
Protein, ur: NEGATIVE mg/dL
Specific Gravity, Urine: 1.01 (ref 1.005–1.030)
pH: 5 (ref 5.0–8.0)

## 2021-08-26 LAB — BASIC METABOLIC PANEL
Anion gap: 8 (ref 5–15)
BUN: 13 mg/dL (ref 8–23)
CO2: 22 mmol/L (ref 22–32)
Calcium: 8.8 mg/dL — ABNORMAL LOW (ref 8.9–10.3)
Chloride: 108 mmol/L (ref 98–111)
Creatinine, Ser: 0.9 mg/dL (ref 0.44–1.00)
GFR, Estimated: >60 mL/min (ref >60)
Glucose, Bld: 98 mg/dL (ref 70–99)
Potassium: 3.7 mmol/L (ref 3.5–5.1)
Sodium: 138 mmol/L (ref 135–145)

## 2021-08-26 LAB — TROPONIN I (HIGH SENSITIVITY): Troponin I (High Sensitivity): 3 ng/L (ref <18)

## 2021-08-26 MED ORDER — HYDROXYZINE HCL 50 MG PO TABS
50.0000 mg | ORAL_TABLET | Freq: Once | ORAL | Status: AC
  Administered 2021-08-26: 50 mg via ORAL
  Filled 2021-08-26: qty 1

## 2021-08-26 MED ORDER — HYDROXYZINE HCL 25 MG PO TABS: 25.0000 mg | ORAL_TABLET | Freq: Three times a day (TID) | ORAL | 0 refills | Status: DC | PRN

## 2021-08-26 NOTE — ED Triage Notes (Signed)
Patient c/o diarrhea and becoming diaphoretic at night X3 weeks. Reports she has to change her clothes because they are drenched in sweat. States "my heart feels like it is going to jump out of my chest." EMS administered 500normal saline. A&O x4 in triage

## 2021-08-26 NOTE — ED Triage Notes (Signed)
Per EMS report: pt from home c/o palpitations, weakness, "not feeling well" x 3 weeks. IV started with EMS and 500NS given.

## 2021-08-26 NOTE — ED Provider Notes (Signed)
[facility] Provider Note    Event Date/Time   First MD Initiated Contact with Patient 08/26/21 1137     (approximate)  History   Chief Complaint: Palpitations and Weakness  HPI  TRILBA LISA is a 61 y.o. female with a past medical history of anxiety, chronic pain, depression, hypertension, presents to the emergency department for 3 weeks of generalized fatigue weakness diarrhea palpitations.  According to the patient for the past 3 weeks she has been experiencing generalized fatigue.  States 1 or 2 episodes of diarrhea on a daily basis as well as feeling palpitations and sweating at times.  Patient states she has been on Xanax for the past 20 years and 3-1/2 weeks ago was taken off of this medication by her doctor.  Patient denies any chest pain.  No fever.  States she was diagnosed with a possible urinary tract infection 3 weeks ago but stopped taking the antibiotics early.  Physical Exam   Triage Vital Signs: ED Triage Vitals  Enc Vitals Group     BP 08/26/21 0956 140/85     Pulse Rate 08/26/21 0956 72     Resp 08/26/21 0956 16     Temp 08/26/21 0956 98.1 F (36.7 C)     Temp Source 08/26/21 0956 Oral     SpO2 08/26/21 0956 95 %     Weight 08/26/21 0958 153 lb (69.4 kg)     Height 08/26/21 0958 5' 5.5" (1.664 m)     Head Circumference --      Peak Flow --      Pain Score 08/26/21 0958 0     Pain Loc --      Pain Edu? --      Excl. in [place]? --     Most recent vital signs: Vitals:   08/26/21 0956  BP: 140/85  Pulse: 72  Resp: 16  Temp: 98.1 F (36.7 C)  SpO2: 95%    General: Awake, no distress.  CV:  Good peripheral perfusion.  Regular rate and rhythm  Resp:  Normal effort.  Equal breath sounds bilaterally.  Abd:  No distention.  Soft, nontender.  No rebound or guarding.   ED Results / Procedures / Treatments   EKG  EKG viewed and interpreted by myself shows a normal sinus rhythm at 67 bpm with a narrow QRS, normal axis, normal  intervals, no concerning ST changes.  MEDICATIONS ORDERED IN ED: Medications - No data to display   IMPRESSION / MDM / [place] / ED COURSE  I reviewed the triage vital signs and the nursing notes.  Patient presents emergency department for generalized symptoms of diarrhea weakness palpitations and sweating at times.  Patient's lab work appears normal and reassuring BMP no signs of dehydration, normal CBC.  I have added on a troponin which has resulted negative as well.  EKG appears reassuring.  Patient's symptoms seem to correlate very well with patient's discontinuation of Xanax.  Patient states she recently changed doctors who discontinued her Xanax.  Patient is currently looking for a new PCP.  Patient is also on chronic opioids as well but has been taking those as prescribed and states she has plenty of that medication left.  Patient has a reassuring physical exam reassuring work-up.  We will dose hydroxyzine to see if this helps with the patient's symptoms.  Could possibly be more of a prolonged withdrawal state as the patient states she was on this medication for over 20  years.  Given the patient's otherwise reassuring work-up I believe she would be safe for discharge home.  We will prescribe hydroxyzine discussed with the patient to follow-up with a PCP.  Patient agreeable to plan of care.  Urinalysis shows no sign of infection.  We will discharge with short course of hydroxyzine and have the patient follow-up with a PCP.  Patient agreeable to plan.  FINAL CLINICAL IMPRESSION(S) / ED DIAGNOSES   Weakness  Rx / DC Orders   Hydroxyzine  Note:  This document was prepared using Dragon voice recognition software and may include unintentional dictation errors.   Harvest Dark, MD 08/26/21 1228

## 2021-08-26 NOTE — ED Notes (Signed)
L ARM IV REMOVED. Cath tip intact

## 2021-08-26 NOTE — ED Notes (Signed)
DC ppw provided to patient. RX info and followup information reviewed. PT questions answered. pt declines vs at time of DC. PT provides verbal consent for dc. Pt ambulatory to lobby alert and oriented x4. 

## 2022-01-20 ENCOUNTER — Encounter: Payer: Self-pay | Admitting: Obstetrics and Gynecology

## 2022-03-03 ENCOUNTER — Emergency Department: Payer: 59

## 2022-03-03 ENCOUNTER — Emergency Department
Admission: EM | Admit: 2022-03-03 | Discharge: 2022-03-03 | Disposition: A | Discharge status: Home / Self Care | Payer: 59 | Attending: Emergency Medicine | Admitting: Emergency Medicine

## 2022-03-03 ENCOUNTER — Other Ambulatory Visit: Payer: Self-pay

## 2022-03-03 DIAGNOSIS — J45909 Unspecified asthma, uncomplicated: Secondary | ICD-10-CM | POA: Insufficient documentation

## 2022-03-03 DIAGNOSIS — R879 Unspecified abnormal finding in specimens from female genital organs: Secondary | ICD-10-CM | POA: Insufficient documentation

## 2022-03-03 DIAGNOSIS — N189 Chronic kidney disease, unspecified: Secondary | ICD-10-CM | POA: Insufficient documentation

## 2022-03-03 DIAGNOSIS — I129 Hypertensive chronic kidney disease with stage 1 through stage 4 chronic kidney disease, or unspecified chronic kidney disease: Secondary | ICD-10-CM | POA: Insufficient documentation

## 2022-03-03 DIAGNOSIS — R531 Weakness: Secondary | ICD-10-CM | POA: Diagnosis present

## 2022-03-03 DIAGNOSIS — Z1152 Encounter for screening for COVID-19: Secondary | ICD-10-CM | POA: Diagnosis not present

## 2022-03-03 DIAGNOSIS — N3000 Acute cystitis without hematuria: Secondary | ICD-10-CM | POA: Diagnosis not present

## 2022-03-03 LAB — URINALYSIS, ROUTINE W REFLEX MICROSCOPIC
Bilirubin Urine: NEGATIVE
Glucose, UA: NEGATIVE mg/dL
Hgb urine dipstick: NEGATIVE
Ketones, ur: NEGATIVE mg/dL
Nitrite: NEGATIVE
Protein, ur: NEGATIVE mg/dL
Specific Gravity, Urine: 1.027 (ref 1.005–1.030)
pH: 5 (ref 5.0–8.0)

## 2022-03-03 LAB — CBC
HCT: 40.4 % (ref 36.0–46.0)
Hemoglobin: 13.7 g/dL (ref 12.0–15.0)
MCH: 30.3 pg (ref 26.0–34.0)
MCHC: 33.9 g/dL (ref 30.0–36.0)
MCV: 89.4 fL (ref 80.0–100.0)
Platelets: 294 10*3/uL (ref 150–400)
RBC: 4.52 MIL/uL (ref 3.87–5.11)
RDW: 13.2 % (ref 11.5–15.5)
WBC: 9 10*3/uL (ref 4.0–10.5)
nRBC: 0 % (ref 0.0–0.2)

## 2022-03-03 LAB — BASIC METABOLIC PANEL
Anion gap: 7 (ref 5–15)
BUN: 16 mg/dL (ref 8–23)
CO2: 25 mmol/L (ref 22–32)
Calcium: 9.1 mg/dL (ref 8.9–10.3)
Chloride: 109 mmol/L (ref 98–111)
Creatinine, Ser: 0.94 mg/dL (ref 0.44–1.00)
GFR, Estimated: >60 mL/min (ref >60)
Glucose, Bld: 98 mg/dL (ref 70–99)
Potassium: 3.5 mmol/L (ref 3.5–5.1)
Sodium: 141 mmol/L (ref 135–145)

## 2022-03-03 LAB — WET PREP, GENITAL
Clue Cells Wet Prep HPF POC: NONE SEEN
Sperm: NONE SEEN
Trich, Wet Prep: NONE SEEN
WBC, Wet Prep HPF POC: <10 (ref <10)
Yeast Wet Prep HPF POC: NONE SEEN

## 2022-03-03 LAB — RESP PANEL BY RT-PCR (FLU A&B, COVID) ARPGX2
Influenza A by PCR: NEGATIVE
Influenza B by PCR: NEGATIVE
SARS Coronavirus 2 by RT PCR: NEGATIVE

## 2022-03-03 MED ORDER — CYCLOBENZAPRINE HCL 5 MG PO TABS: 5.0000 mg | ORAL_TABLET | Freq: Three times a day (TID) | ORAL | 0 refills | Status: DC | PRN

## 2022-03-03 MED ORDER — NITROFURANTOIN MONOHYD MACRO 100 MG PO CAPS
100.0000 mg | ORAL_CAPSULE | Freq: Once | ORAL | Status: AC
  Administered 2022-03-03: 100 mg via ORAL
  Filled 2022-03-03: qty 1

## 2022-03-03 MED ORDER — NITROFURANTOIN MONOHYD MACRO 100 MG PO CAPS: 100.0000 mg | ORAL_CAPSULE | Freq: Two times a day (BID) | ORAL | 0 refills | Status: AC

## 2022-03-03 NOTE — Discharge Instructions (Signed)
Your exam, labs, EKG, and chest x-ray are all normal and reassuring at this time.  No evidence of flu or COVID.  You have some mild bacteria in the urine, so please take the antibiotic as directed.  Increase fluid intake and regular bathroom breaks.  Follow-up with your primary provider for ongoing symptoms.

## 2022-03-03 NOTE — ED Triage Notes (Signed)
Pt here with weakness x1 week. Pt states she went to her primary and was tested for covid and flu but was negative. Pt states that she feels weaker than normal and her upper back is in pain. Pt states she feels like she is running a fever at night because she wakes up sweating. Pt stable in triage.

## 2022-03-03 NOTE — ED Provider Notes (Signed)
[facility] Emergency Department Provider Note     Event Date/Time   First MD Initiated Contact with Patient 03/03/22 1131     (approximate)   History   Weakness and Back Pain   HPI  Ann Pena is a 61 y.o. female depression, anxiety, GERD, asthma, hypertension, CKD, presents to the ED for evaluation generalized weakness.  Patient reports symptoms for the last week.  She went to her PCP was tested for COVID and flu was negative.  She reports feeling weaker than normal.  He is concerned for possible UTI, noting at her visit yesterday at the urgent care they did not test her urine.  They did however, send her with a prescription for Tamiflu despite her negative flu test.  She also endorses some increased vaginal secretions.  She denies any concern for STI as she is not sexually active.  She feels like she is febrile at night, and does endorse some night sweats.  She denies any frank chest pain or shortness of breath.     Physical Exam   Triage Vital Signs: ED Triage Vitals [03/03/22 1043]  Enc Vitals Group     BP 113/69     Pulse Rate 74     Resp 18     Temp 97.9 F (36.6 C)     Temp Source Oral     SpO2 95 %     Weight 153 lb (69.4 kg)     Height 5' 5.5" (1.664 m)     Head Circumference      Peak Flow      Pain Score 5     Pain Loc      Pain Edu?      Excl. in [place]?     Most recent vital signs: Vitals:   03/03/22 1043 03/03/22 1331  BP: 113/69 118/70  Pulse: 74 78  Resp: 18 17  Temp: 97.9 F (36.6 C) 98 F (36.7 C)  SpO2: 95% 98%    General Awake, no distress.  NAD CV:  Good peripheral perfusion.  RESP:  Normal effort.  ABD:  No distention.  Soft and nontender.  Mild tender to the left CVA. MSK:  Tender to the right scapulothoracic region with a palpable muscle spasm noted to the same region. GU:  Deferred   ED Results / Procedures / Treatments   Labs (all labs ordered are listed, but only abnormal results are  displayed) Labs Reviewed  URINALYSIS, ROUTINE W REFLEX MICROSCOPIC - Abnormal; Notable for the following components:      Result Value   Color, Urine YELLOW (*)    APPearance HAZY (*)    Leukocytes,Ua LARGE (*)    Bacteria, UA RARE (*)    All other components within normal limits  RESP PANEL BY RT-PCR (FLU A&B, COVID) ARPGX2  WET PREP, GENITAL  BASIC METABOLIC PANEL  CBC  CBG MONITORING, ED     EKG  Vent. rate 77 BPM PR interval 134 ms QRS duration 96 ms QT/QTcB 402/454 ms P-R-T axes 78 84 73 Normal axis No STEMI  RADIOLOGY  I personally viewed and evaluated these images as part of my medical decision making, as well as reviewing the written report by the radiologist.  ED Provider Interpretation: no acute findings  DG Chest 2 View  Result Date: 03/03/2022 CLINICAL DATA:  Weakness and upper back pain. EXAM: CHEST - 2 VIEW COMPARISON:  None Available. FINDINGS: The heart size and mediastinal contours are within  normal limits. The lungs are hyperinflated. Both lungs are clear. Radiopaque surgical clips are seen within the right upper quadrant. A radiopaque fixation plate and screws are seen within the proximal left humerus. The visualized skeletal structures are unremarkable. IMPRESSION: No active cardiopulmonary disease. Electronically Signed   By: Virgina Norfolk M.D.   On: 03/03/2022 11:00     PROCEDURES:  Critical Care performed: No  Procedures   MEDICATIONS ORDERED IN ED: Medications  nitrofurantoin (macrocrystal-monohydrate) (MACROBID) capsule 100 mg (100 mg Oral Given 03/03/22 1331)     IMPRESSION / MDM / ASSESSMENT AND PLAN / ED COURSE  I reviewed the triage vital signs and the nursing notes.                              Differential diagnosis includes, but is not limited to, COVID, flu, RSV, electrolyte abnormalities, CAP, bronchitis, vaginitis, UTI, pyelonephritis  Patient's presentation is most consistent with acute complicated illness / injury  requiring diagnostic workup.  The patient is on the cardiac monitor to evaluate for evidence of arrhythmia and/or significant heart rate changes.  To the ED for evaluation of 1 week of generalized weakness she denies any interim fevers but does note chills and night sweats.  Patient is evaluated for complaints in ED, found have reassuring exam and work-up overall.  Vital signs are stable without signs of fever or tachycardia.  Labs are reassuring as it shows no acute leukocytosis, critical anemia, or electrolyte abnormality.  EKG without ST changes or malignant arrhythmia.  Chest x-ray does not show any acute infectious process, based on my interpretation.  Patient's urine does show some mild bacteriuria and her wet prep is negative for any yeast or BV.  Patient's diagnosis is consistent with UTI and muscle spasm. Patient will be discharged home with prescriptions for Macrobid and Flexeril. Patient is to follow up with her primary provider as needed or otherwise directed. Patient is given ED precautions to return to the ED for any worsening or new symptoms.     FINAL CLINICAL IMPRESSION(S) / ED DIAGNOSES   Final diagnoses:  Acute cystitis without hematuria     Rx / DC Orders   ED Discharge Orders          Ordered    nitrofurantoin, macrocrystal-monohydrate, (MACROBID) 100 MG capsule  2 times daily        03/03/22 1310    cyclobenzaprine (FLEXERIL) 5 MG tablet  3 times daily PRN        03/03/22 1329             Note:  This document was prepared using Dragon voice recognition software and may include unintentional dictation errors.    Melvenia Needles, PA-C 03/03/22 1336    Lucillie Garfinkel, MD 03/03/22 1517

## 2022-12-14 ENCOUNTER — Other Ambulatory Visit: Payer: Self-pay | Admitting: Family Medicine

## 2022-12-14 DIAGNOSIS — Z1231 Encounter for screening mammogram for malignant neoplasm of breast: Secondary | ICD-10-CM

## 2022-12-22 ENCOUNTER — Ambulatory Visit
Admission: RE | Admit: 2022-12-22 | Discharge: 2022-12-22 | Disposition: A | Discharge status: Home / Self Care | Payer: 59 | Source: Ambulatory Visit | Attending: Family Medicine | Admitting: Family Medicine

## 2022-12-22 DIAGNOSIS — Z1231 Encounter for screening mammogram for malignant neoplasm of breast: Secondary | ICD-10-CM | POA: Diagnosis present

## 2022-12-25 ENCOUNTER — Encounter: Payer: Self-pay | Admitting: Family Medicine

## 2023-01-01 ENCOUNTER — Other Ambulatory Visit: Payer: Self-pay | Admitting: Family Medicine

## 2023-01-01 DIAGNOSIS — R928 Other abnormal and inconclusive findings on diagnostic imaging of breast: Secondary | ICD-10-CM

## 2023-01-10 ENCOUNTER — Ambulatory Visit
Admission: RE | Admit: 2023-01-10 | Discharge: 2023-01-10 | Disposition: A | Discharge status: Home / Self Care | Payer: 59 | Source: Ambulatory Visit | Attending: Family Medicine | Admitting: Family Medicine

## 2023-01-10 DIAGNOSIS — R928 Other abnormal and inconclusive findings on diagnostic imaging of breast: Secondary | ICD-10-CM

## 2023-01-30 ENCOUNTER — Ambulatory Visit: Payer: 59 | Attending: Cardiology | Admitting: Cardiology

## 2023-01-30 ENCOUNTER — Encounter: Payer: Self-pay | Admitting: Cardiology

## 2023-01-30 VITALS — BP 104/70 | HR 78 | Ht 65.5 in | Wt 154.2 lb

## 2023-01-30 DIAGNOSIS — R072 Precordial pain: Secondary | ICD-10-CM

## 2023-01-30 DIAGNOSIS — E782 Mixed hyperlipidemia: Secondary | ICD-10-CM

## 2023-01-30 DIAGNOSIS — R0609 Other forms of dyspnea: Secondary | ICD-10-CM | POA: Diagnosis not present

## 2023-01-30 DIAGNOSIS — E785 Hyperlipidemia, unspecified: Secondary | ICD-10-CM

## 2023-01-30 DIAGNOSIS — F17200 Nicotine dependence, unspecified, uncomplicated: Secondary | ICD-10-CM

## 2023-01-30 MED ORDER — EZETIMIBE 10 MG PO TABS: 10.0000 mg | ORAL_TABLET | Freq: Every day | ORAL | 6 refills | Status: DC

## 2023-01-30 MED ORDER — ASPIRIN 81 MG PO TBEC: 81.0000 mg | DELAYED_RELEASE_TABLET | Freq: Every day | ORAL | 3 refills | Status: AC

## 2023-01-30 MED ORDER — METOPROLOL TARTRATE 100 MG PO TABS: 100.0000 mg | ORAL_TABLET | Freq: Once | ORAL | 0 refills | Status: DC

## 2023-01-30 MED ORDER — REPATHA SURECLICK 140 MG/ML ~~LOC~~ SOAJ: 140.0000 mg | SUBCUTANEOUS | 2 refills | Status: DC

## 2023-01-30 NOTE — Progress Notes (Signed)
Cardiology Office Note:    Date:  01/30/2023   ID:  Shekela, Vayner [birth year], MRN [id number]  PCP:  Leanna Sato, MD   [facility] Providers Cardiologist:  Debbe Odea, MD     Referring MD: [facility]   Chief Complaint  Patient presents with   New Patient (Initial Visit)    Referred for cardiac evaluation of typical chest pain, PCP/referral notes in media.  Previously seen by Dr. Kirke Corin in 2020. Patient concerns of intermittent left side chest pian with occasional radiates to right shoulder, fatigue, and tachycardia    History of Present Illness:    SOMTOCHUKWU MAHALA is a 62 y.o. female with a hx of hyperlipidemia, anxiety, current smoker x 50+ years who presents with chest pain.  Endorses symptoms of chest pain ongoing over the past several months.  Symptoms sometimes occur with exertion, also occurs when she lays down.  Endorses shortness of breath with exertion and fatigue.  She is a current smoker x 50+ years.  She was last seen in our office back in 2020 with symptoms of atypical chest pain and severe anxiety.  Also has PTSD due to son being murdered in 2011.  Workup with echocardiogram and Lexiscan Myoview in 2019 were unrevealing.  She is intolerant to statins, deviously prescribed Zetia 10 mg daily.    Currently not taking any cholesterol medications for at least 3 to 4 years.  Prior notes/cardiac testing Echo 2019 EF 55 to 60% Lexiscan Myoview 2019, low risk, no evidence for ischemia Statin intolerance.  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 Kathie Rhodes. Iftikhar--LA Grade B reflux esophagitis. Hiatus hernia.  Gastritis. Overview:  Dr. Kathie Rhodes. Iftikhar--LA Grade B reflux esophagitis. Hiatus hernia. Gastritis.   Hypertension 03/18/2012   Infective otitis externa, unspecified    Murmur 10/17/2013   Obesity, unspecified    Other and unspecified hyperlipidemia    Other nonspecific abnormal serum enzyme levels    Pain in joint, lower leg    Pain in limb    Panic disorder without agoraphobia    Posttraumatic stress disorder    Prediabetes 09/20/2017   Screening for malignant neoplasm of breast 04/04/2012   Shortness of breath    Syncope and collapse     Past Surgical History:  Procedure Laterality Date   CHOLECYSTECTOMY N/A 10/15/2017   Procedure: LAPAROSCOPIC CHOLECYSTECTOMY;  Surgeon: Henrene Dodge, MD;  Location: [facility] ORS;  Service: General;  Laterality: N/A;   SHOULDER SURGERY     TOTAL ABDOMINAL HYSTERECTOMY      Current Medications: Current Meds  Medication Sig   aspirin EC 81 MG tablet Take 1 tablet (81 mg total) by mouth daily. Swallow whole.   Evolocumab (REPATHA SURECLICK) 140 MG/ML SOAJ Inject 140 mg into the skin every 14 (fourteen) days.   LORazepam (ATIVAN) 1 MG tablet Take 1 mg by mouth 2 (two) times daily as needed.   metoprolol tartrate (LOPRESSOR) 100 MG tablet Take 1 tablet (100 mg total) by mouth once for 1 dose. TWO HOURS PRIOR TO CARDIAC CT   mirtazapine (REMERON) 45 MG tablet Take 45 mg by mouth at bedtime.  omeprazole (PRILOSEC) 20 MG capsule Take 1 capsule (20 mg total) by mouth every other day.   zolpidem (AMBIEN) 5 MG tablet Take 10 mg by mouth at bedtime.     Allergies:   Yellow jacket venom [bee venom], Codeine, Penicillins, and Sulfa antibiotics   Social History   Socioeconomic History   Marital status: Divorced    Spouse name: Not on file   Number of children: 1   Years of education: Not on file   Highest education level: 12th grade  Occupational History   Occupation: Disabled  Tobacco Use   Smoking status: Every Day    Current packs/day: 1.00    Average  packs/day: 1 pack/day for 50.0 years (50.0 ttl pk-yrs)    Types: Cigarettes   Smokeless tobacco: Never  Vaping Use   Vaping status: Never Used  Substance and Sexual Activity   Alcohol use: No   Drug use: No   Sexual activity: Not Currently    Birth control/protection: Abstinence  Other Topics Concern   Not on file  Social History Narrative   Not on file   Social Determinants of Health   Financial Resource Strain: Low Risk  (08/21/2017)   Overall Financial Resource Strain (CARDIA)    Difficulty of Paying Living Expenses: Not hard at all  Food Insecurity: No Food Insecurity (08/21/2017)   Hunger Vital Sign    Worried About Running Out of Food in the Last Year: Never true    Ran Out of Food in the Last Year: Never true  Transportation Needs: No Transportation Needs (08/21/2017)   PRAPARE - Administrator, Civil Service (Medical): No    Lack of Transportation (Non-Medical): No  Physical Activity: Inactive (08/21/2017)   Exercise Vital Sign    Days of Exercise per Week: 0 days    Minutes of Exercise per Session: 0 min  Stress: Stress Concern Present (08/21/2017)   [employer] of Occupational Health - Occupational Stress Questionnaire    Feeling of Stress : Very much  Social Connections: Unknown (08/21/2017)   Social Connection and Isolation Panel [NHANES]    Frequency of Communication with Friends and Family: Patient declined    Frequency of Social Gatherings with Friends and Family: Patient declined    Attends Religious Services: Patient declined    Database administrator or Organizations: Patient declined    Attends Engineer, structural: Patient declined    Marital Status: Divorced     Family History: The patient's family history includes Bone cancer in her father; COPD in her mother; Dementia in her mother; Heart attack in her father; Heart disease in her father and mother; Hypertension in her father and mother; Stroke in her mother; Thyroid disease in  her sister. There is no history of Breast cancer.  ROS:   Please see the history of present illness.     All other systems reviewed and are negative.  EKGs/Labs/Other Studies Reviewed:    The following studies were reviewed today:  EKG Interpretation Date/Time:  Tuesday January 30 2023 09:16:07 EDT Ventricular Rate:  78 PR Interval:  128 QRS Duration:  92 QT Interval:  410 QTC Calculation: 467 R Axis:   71  Text Interpretation: Normal sinus rhythm Incomplete right bundle branch block Confirmed by Debbe Odea ([id number]) on 01/30/2023 9:26:04 AM    Recent Labs: 03/03/2022: BUN 16; Creatinine, Ser 0.94; Hemoglobin 13.7; Platelets 294; Potassium 3.5; Sodium 141  Recent Lipid Panel    Component Value Date/Time  CHOL 308 (H) 09/20/2017 1132   CHOL 276 (H) 07/26/2015 1149   TRIG 161 (H) 09/20/2017 1132   HDL 42 (L) 09/20/2017 1132   HDL 30 (L) 07/26/2015 1149   CHOLHDL 7.3 (H) 09/20/2017 1132   VLDL 58 (H) 06/19/2016 1513   LDLCALC 233 (H) 09/20/2017 1132     Risk Assessment/Calculations:             Physical Exam:    VS:  BP 104/70 (BP Location: Left Arm, Patient Position: Sitting, Cuff Size: Normal)   Pulse 78   Ht 5' 5.5" (1.664 m)   Wt 154 lb 3.2 oz (69.9 kg)   SpO2 96%   BMI 25.27 kg/m     Wt Readings from Last 3 Encounters:  01/30/23 154 lb 3.2 oz (69.9 kg)  03/03/22 153 lb (69.4 kg)  08/26/21 153 lb (69.4 kg)     GEN:  Well nourished, well developed in no acute distress HEENT: Normal NECK: No JVD; No carotid bruits CARDIAC: RRR, no murmurs, rubs, gallops RESPIRATORY:  Clear to auscultation without rales, wheezing or rhonchi  ABDOMEN: Soft, non-tender, non-distended MUSCULOSKELETAL:  No edema; No deformity  SKIN: Warm and dry NEUROLOGIC:  Alert and oriented x 3 PSYCHIATRIC:  Normal affect   ASSESSMENT:    1. Precordial pain   2. Mixed hyperlipidemia   3. Smoking   4. Dyspnea on exertion   5. Dyslipidemia    PLAN:    In order of  problems listed above:  Chest pain, risk factors current smoker, hyperlipidemia.  Get echo, get coronary CTA.  Start aspirin 81 mg daily. Hyperlipidemia, LDL over 200 indicating likely FH.  Not tolerant to statins.  Start Zetia 10 mg daily, start PCSK9. Current smoker, smoking cessation advised. Dyspnea on exertion, cardiac workup with echo and CTA as above.  If unrevealing, plan to refer to pulmonary medicine for COPD eval due to 50+ years of smoking.  Deconditioning, depression may also be contributing.  Follow-up after cardiac testing.      Medication Adjustments/Labs and Tests Ordered: Current medicines are reviewed at length with the patient today.  Concerns regarding medicines are outlined above.  Orders Placed This Encounter  Procedures   CT CORONARY MORPH W/CTA COR W/SCORE W/CA W/CM &/OR WO/CM   Basic Metabolic Panel (BMET)   EKG 12-Lead   ECHOCARDIOGRAM COMPLETE   Meds ordered this encounter  Medications   ezetimibe (ZETIA) 10 MG tablet    Sig: Take 1 tablet (10 mg total) by mouth daily.    Dispense:  30 tablet    Refill:  6   aspirin EC 81 MG tablet    Sig: Take 1 tablet (81 mg total) by mouth daily. Swallow whole.    Dispense:  90 tablet    Refill:  3   Evolocumab (REPATHA SURECLICK) 140 MG/ML SOAJ    Sig: Inject 140 mg into the skin every 14 (fourteen) days.    Dispense:  2 mL    Refill:  2   metoprolol tartrate (LOPRESSOR) 100 MG tablet    Sig: Take 1 tablet (100 mg total) by mouth once for 1 dose. TWO HOURS PRIOR TO CARDIAC CT    Dispense:  1 tablet    Refill:  0    Patient Instructions  Medication Instructions:   1. START Aspirin 81 - Take one tablet (81 mg) by mouth daily.  2. START Zetia - Take one tablet ( 10mg ) by mouth daily.  3. START Repatha - Inject one  pen into skin every 14 days.  *If you need a refill on your cardiac medications before your next appointment, please call your pharmacy*   Lab Work:  Your physician recommends you have lab  work today - BMP  If you have labs (blood work) drawn today and your tests are completely normal, you will receive your results only by: MyChart Message (if you have MyChart) OR A paper copy in the mail If you have any lab test that is abnormal or we need to change your treatment, we will call you to review the results.   Testing/Procedures:  Your physician has requested that you have an echocardiogram. Echocardiography is a painless test that uses sound waves to create images of your heart. It provides your doctor with information about the size and shape of your heart and how well your heart's chambers and valves are working. This procedure takes approximately one hour. There are no restrictions for this procedure. Please do NOT wear cologne, perfume, aftershave, or lotions (deodorant is allowed). Please arrive 15 minutes prior to your appointment time.    Your cardiac CT will be scheduled at one of the below locations:   [facility] [address] [place], [place] [address] [phone]  OR   [facility] Heart and Vascular entrance [address] [place], [place] [address] [phone]   If scheduled at [facility] or [facility], please arrive 15 mins early for check-in and test prep.  There is spacious parking and easy access to the radiology department from the . Please enter here and check-in with the desk attendant.   Please follow these instructions carefully (unless otherwise directed):  An IV will be required for this test and Nitroglycerin will be given.  Hold all erectile dysfunction medications at least 3 days (72 hrs) prior to test. (Ie viagra, cialis, sildenafil, tadalafil, etc)   On the Night Before the Test: Be sure to Drink plenty of water. Do not consume any caffeinated/decaffeinated beverages or chocolate 12 hours prior to your test. Do not take any antihistamines  12 hours prior to your test.  On the Day of the Test: Drink plenty of water until 1 hour prior to the test. Do not eat any food 1 hour prior to test. You may take your regular medications prior to the test.  Take metoprolol (Lopressor) two hours prior to test. If you take Furosemide/Hydrochlorothiazide/Spironolactone, please HOLD on the morning of the test. FEMALES- please wear underwire-free bra if available, avoid dresses & tight clothing      After the Test: Drink plenty of water. After receiving IV contrast, you may experience a mild flushed feeling. This is normal. On occasion, you may experience a mild rash up to 24 hours after the test. This is not dangerous. If this occurs, you can take Benadryl 25 mg and increase your fluid intake. If you experience trouble breathing, this can be serious. If it is severe call 911 IMMEDIATELY. If it is mild, please call our office. If you take any of these medications: Glipizide/Metformin, Avandament, Glucavance, please do not take 48 hours after completing test unless otherwise instructed.  We will call to schedule your test 2-4 weeks out understanding that some insurance companies will need an authorization prior to the service being performed.   For more information and frequently asked questions, please visit our website : [email or web address]  For non-scheduling related questions, please contact the cardiac imaging nurse navigator should you  have any questions/concerns: Cardiac Imaging Nurse Navigators Direct Office Dial: [phone]   For scheduling needs, including cancellations and rescheduling, please call [place], [phone].   Follow-Up: At [facility], you and your health needs are our priority.  As part of our continuing mission to provide you with exceptional heart care, we have created designated Provider Care Teams.  These Care Teams include your primary Cardiologist (physician) and Advanced Practice  Providers (APPs -  Physician Assistants and Nurse Practitioners) who all work together to provide you with the care you need, when you need it.  We recommend signing up for the patient portal called "MyChart".  Sign up information is provided on this After Visit Summary.  MyChart is used to connect with patients for Virtual Visits (Telemedicine).  Patients are able to view lab/test results, encounter notes, upcoming appointments, etc.  Non-urgent messages can be sent to your provider as well.   To learn more about what you can do with MyChart, go to [email or web address].    Your next appointment:    After Cardiac testing  Provider:   You may see Debbe Odea, MD or one of the following Advanced Practice Providers on your designated Care Team:   Nicolasa Ducking, NP Eula Listen, PA-C Cadence Fransico Michael, PA-C Charlsie Quest, NP    Signed, Debbe Odea, MD  01/30/2023 10:25 AM    [facility]

## 2023-01-30 NOTE — Patient Instructions (Signed)
Medication Instructions:   1. START Aspirin 81 - Take one tablet (81 mg) by mouth daily.  2. START Zetia - Take one tablet ( 10mg ) by mouth daily.  3. START Repatha - Inject one pen into skin every 14 days.  *If you need a refill on your cardiac medications before your next appointment, please call your pharmacy*   Lab Work:  Your physician recommends you have lab work today - BMP  If you have labs (blood work) drawn today and your tests are completely normal, you will receive your results only by: MyChart Message (if you have MyChart) OR A paper copy in the mail If you have any lab test that is abnormal or we need to change your treatment, we will call you to review the results.   Testing/Procedures:  Your physician has requested that you have an echocardiogram. Echocardiography is a painless test that uses sound waves to create images of your heart. It provides your doctor with information about the size and shape of your heart and how well your heart's chambers and valves are working. This procedure takes approximately one hour. There are no restrictions for this procedure. Please do NOT wear cologne, perfume, aftershave, or lotions (deodorant is allowed). Please arrive 15 minutes prior to your appointment time.    Your cardiac CT will be scheduled at one of the below locations:   [facility] [address] [place], [place] [address] [phone]  OR   [facility] Heart and Vascular entrance [address] [place], [place] [address] [phone]   If scheduled at [facility] or [facility], please arrive 15 mins early for check-in and test prep.  There is spacious parking and easy access to the radiology department from the . Please enter here and check-in with the desk attendant.   Please follow these instructions carefully (unless otherwise directed):  An IV will be required  for this test and Nitroglycerin will be given.  Hold all erectile dysfunction medications at least 3 days (72 hrs) prior to test. (Ie viagra, cialis, sildenafil, tadalafil, etc)   On the Night Before the Test: Be sure to Drink plenty of water. Do not consume any caffeinated/decaffeinated beverages or chocolate 12 hours prior to your test. Do not take any antihistamines 12 hours prior to your test.  On the Day of the Test: Drink plenty of water until 1 hour prior to the test. Do not eat any food 1 hour prior to test. You may take your regular medications prior to the test.  Take metoprolol (Lopressor) two hours prior to test. If you take Furosemide/Hydrochlorothiazide/Spironolactone, please HOLD on the morning of the test. FEMALES- please wear underwire-free bra if available, avoid dresses & tight clothing      After the Test: Drink plenty of water. After receiving IV contrast, you may experience a mild flushed feeling. This is normal. On occasion, you may experience a mild rash up to 24 hours after the test. This is not dangerous. If this occurs, you can take Benadryl 25 mg and increase your fluid intake. If you experience trouble breathing, this can be serious. If it is severe call 911 IMMEDIATELY. If it is mild, please call our office. If you take any of these medications: Glipizide/Metformin, Avandament, Glucavance, please do not take 48 hours after completing test unless otherwise instructed.  We will call to schedule your test 2-4 weeks out understanding that some insurance companies will need an  authorization prior to the service being performed.   For more information and frequently asked questions, please visit our website : [email or web address]  For non-scheduling related questions, please contact the cardiac imaging nurse navigator should you have any questions/concerns: Cardiac Imaging Nurse Navigators Direct Office Dial: [phone]   For scheduling needs,  including cancellations and rescheduling, please call [place], [phone].   Follow-Up: At [facility], you and your health needs are our priority.  As part of our continuing mission to provide you with exceptional heart care, we have created designated Provider Care Teams.  These Care Teams include your primary Cardiologist (physician) and Advanced Practice Providers (APPs -  Physician Assistants and Nurse Practitioners) who all work together to provide you with the care you need, when you need it.  We recommend signing up for the patient portal called "MyChart".  Sign up information is provided on this After Visit Summary.  MyChart is used to connect with patients for Virtual Visits (Telemedicine).  Patients are able to view lab/test results, encounter notes, upcoming appointments, etc.  Non-urgent messages can be sent to your provider as well.   To learn more about what you can do with MyChart, go to [email or web address].    Your next appointment:    After Cardiac testing  Provider:   You may see Debbe Odea, MD or one of the following Advanced Practice Providers on your designated Care Team:   Nicolasa Ducking, NP Eula Listen, PA-C Cadence Fransico Michael, PA-C Charlsie Quest, NP

## 2023-01-31 ENCOUNTER — Inpatient Hospital Stay: Admission: RE | Admit: 2023-01-31 | Payer: 59 | Source: Ambulatory Visit

## 2023-01-31 LAB — BASIC METABOLIC PANEL
BUN/Creatinine Ratio: 12 (ref 12–28)
BUN: 12 mg/dL (ref 8–27)
CO2: 23 mmol/L (ref 20–29)
Calcium: 9.6 mg/dL (ref 8.7–10.3)
Chloride: 100 mmol/L (ref 96–106)
Creatinine, Ser: 0.98 mg/dL (ref 0.57–1.00)
Glucose: 95 mg/dL (ref 70–99)
Potassium: 4.5 mmol/L (ref 3.5–5.2)
Sodium: 140 mmol/L (ref 134–144)
eGFR: 65 mL/min/{1.73_m2} (ref >59)

## 2023-02-01 ENCOUNTER — Telehealth (HOSPITAL_COMMUNITY): Payer: Self-pay | Admitting: Emergency Medicine

## 2023-02-01 NOTE — Telephone Encounter (Signed)
Attempted to call patient regarding upcoming cardiac CT appointment. °Left message on voicemail with name and callback number °Dwan Fennel RN Navigator Cardiac Imaging °[facility] °[phone] Office °[phone] Cell ° °

## 2023-02-01 NOTE — Telephone Encounter (Signed)
 Received call from patient regarding upcoming cardiac imaging study; pt verbalizes understanding of appt date/time, parking situation and where to check in, pre-test NPO status and medications ordered, and verified current allergies; name and call back number provided for further questions should they arise Ann Frame RN Navigator Cardiac Imaging [facility] [phone] office [phone] cell

## 2023-02-02 ENCOUNTER — Encounter: Payer: Self-pay | Admitting: Pharmacy Technician

## 2023-02-02 ENCOUNTER — Telehealth: Payer: Self-pay | Admitting: Pharmacy Technician

## 2023-02-02 ENCOUNTER — Other Ambulatory Visit (HOSPITAL_COMMUNITY): Payer: Self-pay

## 2023-02-02 NOTE — Telephone Encounter (Signed)
Pharmacy Patient Advocate Encounter  Received notification from [facility] that Prior Authorization for repatha has been APPROVED from 02/02/23 to 08/02/23   PA #/Case ID/Reference #: [id number]

## 2023-02-02 NOTE — Telephone Encounter (Signed)
Pharmacy Patient Advocate Encounter   Received notification from CoverMyMeds that prior authorization for repatha is required/requested.   Insurance verification completed.   The patient is insured through [facility] .   Per test claim: PA required; PA submitted to above mentioned insurance via CoverMyMeds Key/confirmation #/EOC [id number] Status is pending

## 2023-02-02 NOTE — Telephone Encounter (Signed)
erorr

## 2023-02-05 ENCOUNTER — Ambulatory Visit: Admission: RE | Admit: 2023-02-05 | Payer: 59 | Source: Ambulatory Visit

## 2023-02-07 ENCOUNTER — Ambulatory Visit
Admission: RE | Admit: 2023-02-07 | Discharge: 2023-02-07 | Disposition: A | Discharge status: Home / Self Care | Payer: 59 | Source: Ambulatory Visit | Attending: Cardiology | Admitting: Cardiology

## 2023-02-07 DIAGNOSIS — R072 Precordial pain: Secondary | ICD-10-CM | POA: Diagnosis present

## 2023-02-07 MED ORDER — NITROGLYCERIN 0.4 MG SL SUBL: 0.8000 mg | SUBLINGUAL_TABLET | Freq: Once | SUBLINGUAL | Status: DC

## 2023-02-07 MED ORDER — NITROGLYCERIN 0.4 MG SL SUBL
0.4000 mg | SUBLINGUAL_TABLET | Freq: Once | SUBLINGUAL | Status: AC
  Administered 2023-02-07: 0.4 mg via SUBLINGUAL
  Filled 2023-02-07: qty 25

## 2023-02-07 MED ORDER — IOHEXOL 350 MG/ML SOLN
80.0000 mL | Freq: Once | INTRAVENOUS | Status: AC | PRN
  Administered 2023-02-07: 80 mL via INTRAVENOUS

## 2023-02-07 NOTE — Progress Notes (Signed)
Pt tolerated procedure well with no issues. Pt ABCs intact. Pt denies any complaints. Pt encouraged to drink plenty of water throughout the day. Pt ambulatory with steady gait.

## 2023-02-12 ENCOUNTER — Other Ambulatory Visit: Payer: Self-pay | Admitting: *Deleted

## 2023-02-12 MED ORDER — REPATHA SURECLICK 140 MG/ML ~~LOC~~ SOAJ: 140.0000 mg | SUBCUTANEOUS | 0 refills | Status: DC

## 2023-02-19 ENCOUNTER — Ambulatory Visit: Payer: 59

## 2023-03-09 ENCOUNTER — Ambulatory Visit: Payer: 59

## 2023-03-14 ENCOUNTER — Ambulatory Visit: Payer: 59 | Admitting: Nurse Practitioner

## 2023-03-29 ENCOUNTER — Ambulatory Visit: Payer: 59 | Attending: Cardiology

## 2023-06-06 ENCOUNTER — Ambulatory Visit: Attending: Cardiology

## 2023-06-06 DIAGNOSIS — R072 Precordial pain: Secondary | ICD-10-CM | POA: Diagnosis not present

## 2023-06-06 LAB — ECHOCARDIOGRAM COMPLETE
AR max vel: 1.99 cm2
AV Area VTI: 1.98 cm2
AV Area mean vel: 1.96 cm2
AV Mean grad: 6 mmHg
AV Peak grad: 10.8 mmHg
Ao pk vel: 1.64 m/s
Area-P 1/2: 3.48 cm2
Calc EF: 50.2 %
S' Lateral: 2.9 cm
Single Plane A2C EF: 50.9 %
Single Plane A4C EF: 50.4 %

## 2023-06-08 ENCOUNTER — Encounter: Payer: Self-pay | Admitting: Emergency Medicine

## 2023-06-19 ENCOUNTER — Encounter: Payer: Self-pay | Admitting: Ophthalmology

## 2023-06-19 NOTE — Anesthesia Preprocedure Evaluation (Addendum)
 Anesthesia Evaluation  Patient identified by MRN, date of birth, ID band Patient awake    Reviewed: Allergy & Precautions, H&P , NPO status , Patient's Chart, lab work & pertinent test results  Airway Mallampati: III  TM Distance: <3 FB Neck ROM: Full    Dental no notable dental hx. (+) Edentulous Upper, Edentulous Lower   Pulmonary neg pulmonary ROS, shortness of breath, asthma , Current Smoker 50+ years smoking   Pulmonary exam normal breath sounds clear to auscultation       Cardiovascular hypertension, + DOE  negative cardio ROS Normal cardiovascular exam+ Valvular Problems/Murmurs  Rhythm:Regular Rate:Normal  01-30-23 1.  Chest pain, risk factors current smoker, hyperlipidemia.  Get echo, get coronary CTA.  Start aspirin 81 mg daily. 2. Hyperlipidemia, LDL over 200 indicating likely FH.  Not tolerant to statins.  Start Zetia 10 mg daily, start PCSK9. 3. Current smoker, smoking cessation advised. 4. Dyspnea on exertion, cardiac workup with echo and CTA as above.  If unrevealing, plan to refer to pulmonary medicine for COPD eval due to 50+ years of smoking.  Deconditioning, depression may also be contributing.   Follow-up after cardiac testing.   06-06-23 1. Left ventricular ejection fraction, by estimation, is 55 to 60%. Left  ventricular ejection fraction by 3D volume is 54 %. The left ventricle has  normal function. The left ventricle has no regional wall motion  abnormalities. Left ventricular diastolic   parameters were normal.   2. Right ventricular systolic function is normal. The right ventricular  size is normal.   3. The mitral valve is normal in structure. Mild mitral valve  regurgitation. No evidence of mitral stenosis.   4. The aortic valve has an indeterminant number of cusps. Aortic valve  regurgitation is not visualized. No aortic stenosis is present.   5. The inferior vena cava is normal in size with  greater than 50%  respiratory variability, suggesting right atrial pressure of 3 mmHg.       Neuro/Psych  Headaches PSYCHIATRIC DISORDERS Anxiety Depression     Neuromuscular disease negative neurological ROS  negative psych ROS   GI/Hepatic negative GI ROS, Neg liver ROS, hiatal hernia,GERD  ,,  Endo/Other  negative endocrine ROS    Renal/GU Renal diseasenegative Renal ROS  negative genitourinary   Musculoskeletal negative musculoskeletal ROS (+)    Abdominal   Peds negative pediatric ROS (+)  Hematology negative hematology ROS (+)   Anesthesia Other Findings   Depression  Chronic kidney disease, stage III (moderate) (HCC) Other nonspecific abnormal serum enzyme levels  Obesity, unspecified Infective otitis externa, unspecified Atrophic 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 Pain in joint, lower leg Pain in limb Panic disorder without agoraphobia Posttraumatic stress disorder due to son murdered in 2011 Shortness of breath  Chest pain, unspecified Anxiety state, unspecified  Syncope and collapse Asthma  Other and unspecified hyperlipidemia Hypertension  Aortic calcification (HCC) GERD (gastroesophageal reflux disease) Murmur H/O endoscopy  Prediabetes Screening for malignant neoplasm of breast  Wears dentures Mild mitral regurgitation by prior echocardiogram  Precordial pain DOE (dyspnea on exertion)      Reproductive/Obstetrics negative OB ROS                              Anesthesia Physical Anesthesia Plan  ASA: 3  Anesthesia Plan: MAC   Post-op Pain Management:    Induction: Intravenous  PONV Risk Score and Plan:   Airway Management Planned: Natural Airway  and Nasal Cannula  Additional Equipment:   Intra-op Plan:   Post-operative Plan:   Informed Consent: I have reviewed the patients History and Physical, chart, labs and discussed the procedure including the risks, benefits and alternatives for the proposed  anesthesia with the patient or authorized representative who has indicated his/her understanding and acceptance.     Dental Advisory Given  Plan Discussed with: Anesthesiologist, CRNA and Surgeon  Anesthesia Plan Comments: (Patient consented for risks of anesthesia including but not limited to:  - adverse reactions to medications - damage to eyes, teeth, lips or other oral mucosa - nerve damage due to positioning  - sore throat or hoarseness - Damage to heart, brain, nerves, lungs, other parts of body or loss of life  Patient voiced understanding and assent.)         Anesthesia Quick Evaluation

## 2023-06-25 ENCOUNTER — Ambulatory Visit
Admission: RE | Admit: 2023-06-25 | Discharge: 2023-06-25 | Disposition: A | Discharge status: Home / Self Care | Attending: Ophthalmology | Admitting: Ophthalmology

## 2023-06-25 ENCOUNTER — Other Ambulatory Visit: Payer: Self-pay

## 2023-06-25 ENCOUNTER — Encounter
Admission: RE | Disposition: A | Discharge status: Home / Self Care | Payer: Self-pay | Source: Home / Self Care | Attending: Ophthalmology

## 2023-06-25 ENCOUNTER — Ambulatory Visit: Payer: Self-pay | Admitting: Anesthesiology

## 2023-06-25 ENCOUNTER — Encounter: Payer: Self-pay | Admitting: Ophthalmology

## 2023-06-25 DIAGNOSIS — H2512 Age-related nuclear cataract, left eye: Secondary | ICD-10-CM | POA: Insufficient documentation

## 2023-06-25 DIAGNOSIS — Z6826 Body mass index (BMI) 26.0-26.9, adult: Secondary | ICD-10-CM | POA: Insufficient documentation

## 2023-06-25 DIAGNOSIS — Z7982 Long term (current) use of aspirin: Secondary | ICD-10-CM | POA: Diagnosis not present

## 2023-06-25 DIAGNOSIS — I129 Hypertensive chronic kidney disease with stage 1 through stage 4 chronic kidney disease, or unspecified chronic kidney disease: Secondary | ICD-10-CM | POA: Diagnosis not present

## 2023-06-25 DIAGNOSIS — F17210 Nicotine dependence, cigarettes, uncomplicated: Secondary | ICD-10-CM | POA: Insufficient documentation

## 2023-06-25 DIAGNOSIS — R0609 Other forms of dyspnea: Secondary | ICD-10-CM | POA: Diagnosis not present

## 2023-06-25 DIAGNOSIS — F32A Depression, unspecified: Secondary | ICD-10-CM | POA: Insufficient documentation

## 2023-06-25 DIAGNOSIS — E669 Obesity, unspecified: Secondary | ICD-10-CM | POA: Insufficient documentation

## 2023-06-25 DIAGNOSIS — N183 Chronic kidney disease, stage 3 unspecified: Secondary | ICD-10-CM | POA: Diagnosis not present

## 2023-06-25 DIAGNOSIS — F419 Anxiety disorder, unspecified: Secondary | ICD-10-CM | POA: Insufficient documentation

## 2023-06-25 DIAGNOSIS — R0602 Shortness of breath: Secondary | ICD-10-CM | POA: Insufficient documentation

## 2023-06-25 DIAGNOSIS — R519 Headache, unspecified: Secondary | ICD-10-CM | POA: Insufficient documentation

## 2023-06-25 DIAGNOSIS — H25042 Posterior subcapsular polar age-related cataract, left eye: Secondary | ICD-10-CM | POA: Diagnosis present

## 2023-06-25 DIAGNOSIS — Z79899 Other long term (current) drug therapy: Secondary | ICD-10-CM | POA: Insufficient documentation

## 2023-06-25 DIAGNOSIS — K219 Gastro-esophageal reflux disease without esophagitis: Secondary | ICD-10-CM | POA: Insufficient documentation

## 2023-06-25 DIAGNOSIS — E785 Hyperlipidemia, unspecified: Secondary | ICD-10-CM | POA: Insufficient documentation

## 2023-06-25 DIAGNOSIS — J45909 Unspecified asthma, uncomplicated: Secondary | ICD-10-CM | POA: Insufficient documentation

## 2023-06-25 DIAGNOSIS — K449 Diaphragmatic hernia without obstruction or gangrene: Secondary | ICD-10-CM | POA: Diagnosis not present

## 2023-06-25 HISTORY — DX: Other forms of dyspnea: R06.09

## 2023-06-25 HISTORY — PX: CATARACT EXTRACTION W/PHACO: SHX586

## 2023-06-25 HISTORY — DX: Presence of dental prosthetic device (complete) (partial): Z97.2

## 2023-06-25 HISTORY — DX: Precordial pain: R07.2

## 2023-06-25 HISTORY — DX: Nonrheumatic mitral (valve) insufficiency: I34.0

## 2023-06-25 SURGERY — PHACOEMULSIFICATION, CATARACT, WITH IOL INSERTION
Anesthesia: Monitor Anesthesia Care | Site: Eye | Laterality: Left

## 2023-06-25 MED ORDER — MIDAZOLAM HCL 2 MG/2ML IJ SOLN
INTRAMUSCULAR | Status: DC | PRN
  Administered 2023-06-25: 2 mg via INTRAVENOUS

## 2023-06-25 MED ORDER — LIDOCAINE HCL (PF) 2 % IJ SOLN
INTRAOCULAR | Status: DC | PRN
  Administered 2023-06-25: 4 mL via INTRAOCULAR

## 2023-06-25 MED ORDER — FENTANYL CITRATE (PF) 100 MCG/2ML IJ SOLN
INTRAMUSCULAR | Status: DC | PRN
  Administered 2023-06-25 (×2): 50 ug via INTRAVENOUS

## 2023-06-25 MED ORDER — SIGHTPATH DOSE#1 BSS IO SOLN
INTRAOCULAR | Status: DC | PRN
  Administered 2023-06-25: 15 mL via INTRAOCULAR

## 2023-06-25 MED ORDER — MIDAZOLAM HCL 2 MG/2ML IJ SOLN
INTRAMUSCULAR | Status: AC
  Filled 2023-06-25: qty 2

## 2023-06-25 MED ORDER — SIGHTPATH DOSE#1 BSS IO SOLN
INTRAOCULAR | Status: DC | PRN
  Administered 2023-06-25: 65 mL via OPHTHALMIC

## 2023-06-25 MED ORDER — TETRACAINE HCL 0.5 % OP SOLN
OPHTHALMIC | Status: AC
  Filled 2023-06-25: qty 4

## 2023-06-25 MED ORDER — ARMC OPHTHALMIC DILATING DROPS
1.0000 | OPHTHALMIC | Status: DC | PRN
  Administered 2023-06-25 (×3): 1 via OPHTHALMIC

## 2023-06-25 MED ORDER — FENTANYL CITRATE (PF) 100 MCG/2ML IJ SOLN
INTRAMUSCULAR | Status: AC
  Filled 2023-06-25: qty 2

## 2023-06-25 MED ORDER — POLYMYXIN B-TRIMETHOPRIM 10000-0.1 UNIT/ML-% OP SOLN
OPHTHALMIC | Status: DC | PRN
  Administered 2023-06-25: 2 [drp] via OPHTHALMIC

## 2023-06-25 MED ORDER — TETRACAINE HCL 0.5 % OP SOLN
1.0000 [drp] | OPHTHALMIC | Status: DC | PRN
Start: 2023-06-25 — End: 2023-06-25
  Administered 2023-06-25 (×3): 1 [drp] via OPHTHALMIC

## 2023-06-25 MED ORDER — SIGHTPATH DOSE#1 NA HYALUR & NA CHOND-NA HYALUR IO KIT
PACK | INTRAOCULAR | Status: DC | PRN
  Administered 2023-06-25: 1 via OPHTHALMIC

## 2023-06-25 MED ORDER — ARMC OPHTHALMIC DILATING DROPS
OPHTHALMIC | Status: AC
  Filled 2023-06-25: qty 0.5

## 2023-06-25 SURGICAL SUPPLY — 12 items
CATARACT SUITE SIGHTPATH (MISCELLANEOUS) ×1 IMPLANT
DISSECTOR HYDRO NUCLEUS 50X22 (MISCELLANEOUS) ×1 IMPLANT
FEE CATARACT SUITE SIGHTPATH (MISCELLANEOUS) ×1 IMPLANT
GLOVE PI ULTRA LF STRL 7.5 (GLOVE) ×1 IMPLANT
GLOVE SURG POLYISOPRENE 8.5 (GLOVE) ×1 IMPLANT
GLOVE SURG PROTEXIS BL SZ6.5 (GLOVE) ×1 IMPLANT
GLOVE SURG SYN 6.5 PF PI BL (GLOVE) ×1 IMPLANT
GLOVE SURG SYN 8.5 PF PI BL (GLOVE) ×1 IMPLANT
LENS IOL TECNIS EYHANCE 15.5 (Intraocular Lens) IMPLANT
NDL FILTER BLUNT 18X1 1/2 (NEEDLE) ×1 IMPLANT
NEEDLE FILTER BLUNT 18X1 1/2 (NEEDLE) ×1 IMPLANT
SYR 3ML LL SCALE MARK (SYRINGE) ×1 IMPLANT

## 2023-06-25 NOTE — Transfer of Care (Signed)
 Immediate Anesthesia Transfer of Care Note  Patient: Ann Pena  Procedure(s) Performed: PHACOEMULSIFICATION, CATARACT, WITH IOL INSERTION 2.52 00:22.4 (Left: Eye)  Patient Location: PACU  Anesthesia Type: MAC  Level of Consciousness: awake, alert  and patient cooperative  Airway and Oxygen Therapy: Patient Spontanous Breathing and Patient connected to supplemental oxygen  Post-op Assessment: Post-op Vital signs reviewed, Patient's Cardiovascular Status Stable, Respiratory Function Stable, Patent Airway and No signs of Nausea or vomiting  Post-op Vital Signs: Reviewed and stable  Complications: No notable events documented.

## 2023-06-25 NOTE — H&P (Signed)
 [facility]   Primary Care Physician:  Leanna Sato, MD Ophthalmologist: Dr. Willey Blade  Pre-Procedure History & Physical: HPI:  Ann Pena is a 63 y.o. female here for cataract surgery.   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 Kathie Rhodes. Iftikhar--LA Grade B reflux esophagitis. Hiatus hernia. Gastritis. Overview:  Dr. Kathie Rhodes. Iftikhar--LA Grade B reflux esophagitis. Hiatus hernia. Gastritis.   Hypertension 03/18/2012   Infective otitis externa, unspecified    Mild mitral regurgitation by prior echocardiogram    Murmur 10/17/2013   Obesity, unspecified    Other and unspecified hyperlipidemia    Other nonspecific abnormal serum enzyme levels    Pain in joint, lower leg    Pain in limb    Panic disorder without agoraphobia    Posttraumatic stress disorder    Precordial pain    Prediabetes 09/20/2017   Screening for malignant neoplasm of breast 04/04/2012   Shortness of breath    Syncope and collapse    Wears dentures    full upper and lower    Past Surgical History:  Procedure Laterality Date   CHOLECYSTECTOMY N/A 10/15/2017   Procedure: LAPAROSCOPIC CHOLECYSTECTOMY;  Surgeon: Henrene Dodge, MD;  Location: [facility] ORS;  Service: General;  Laterality: N/A;   SHOULDER SURGERY     TOTAL ABDOMINAL HYSTERECTOMY  2000    Prior to Admission medications   Medication Sig Start Date End Date Taking? Authorizing Provider  busPIRone (BUSPAR) 5 MG tablet Take 5 mg by mouth 2 (two) times daily.   Yes [provider]  desvenlafaxine (PRISTIQ) 25 MG 24 hr tablet Take 25 mg by mouth daily.   Yes [provider]   esomeprazole (NEXIUM) 20 MG capsule Take 20 mg by mouth daily at 12 noon.   Yes [provider]  mirtazapine (REMERON) 45 MG tablet Take 45 mg by mouth at bedtime.    Yes [provider]  QUEtiapine (SEROQUEL XR) 300 MG 24 hr tablet Take 300 mg by mouth at bedtime.   Yes [provider]  aspirin EC 81 MG tablet Take 1 tablet (81 mg total) by mouth daily. Swallow whole. Patient not taking: Reported on 06/19/2023 01/30/23   Debbe Odea, MD  ezetimibe (ZETIA) 10 MG tablet Take 1 tablet (10 mg total) by mouth daily. Patient not taking: Reported on 06/19/2023 01/30/23   Debbe Odea, MD    Allergies as of 06/13/2023 - Review Complete 02/07/2023  Allergen Reaction Noted   Yellow jacket venom [bee venom] Anaphylaxis and Swelling 10/11/2017   Codeine  08/26/2021   Penicillins Itching, Swelling, and Other (See Comments) 08/14/2013   Sulfa antibiotics Nausea And Vomiting 08/14/2013    Family History  Problem Relation Age of Onset   Hypertension Mother    COPD Mother    Heart disease Mother    Stroke Mother    Dementia Mother    Hypertension Father    Bone cancer Father    Heart attack Father    Heart disease Father    Thyroid disease Sister    Breast cancer Neg Hx     Social  History   Socioeconomic History   Marital status: Divorced    Spouse name: Not on file   Number of children: 1   Years of education: Not on file   Highest education level: 12th grade  Occupational History   Occupation: Disabled  Tobacco Use   Smoking status: Every Day    Current packs/day: 0.50    Average packs/day: 0.5 packs/day for 46.2 years (23.1 ttl pk-yrs)    Types: Cigarettes    Start date: 1979   Smokeless tobacco: Never  Vaping Use   Vaping status: Never Used  Substance and Sexual Activity   Alcohol use: No   Drug use: No   Sexual activity: Not Currently    Birth control/protection: Abstinence  Other Topics Concern   Not on file  Social History  Narrative   Not on file   Social Drivers of Health   Financial Resource Strain: Low Risk  (08/21/2017)   Overall Financial Resource Strain (CARDIA)    Difficulty of Paying Living Expenses: Not hard at all  Food Insecurity: No Food Insecurity (08/21/2017)   Hunger Vital Sign    Worried About Running Out of Food in the Last Year: Never true    Ran Out of Food in the Last Year: Never true  Transportation Needs: No Transportation Needs (08/21/2017)   PRAPARE - Administrator, Civil Service (Medical): No    Lack of Transportation (Non-Medical): No  Physical Activity: Inactive (08/21/2017)   Exercise Vital Sign    Days of Exercise per Week: 0 days    Minutes of Exercise per Session: 0 min  Stress: Stress Concern Present (08/21/2017)   [employer] of Occupational Health - Occupational Stress Questionnaire    Feeling of Stress : Very much  Social Connections: Unknown (08/21/2017)   Social Connection and Isolation Panel [NHANES]    Frequency of Communication with Friends and Family: Patient declined    Frequency of Social Gatherings with Friends and Family: Patient declined    Attends Religious Services: Patient declined    Database administrator or Organizations: Patient declined    Attends Banker Meetings: Patient declined    Marital Status: Divorced  Catering manager Violence: Not At Risk (08/21/2017)   Humiliation, Afraid, Rape, and Kick questionnaire    Fear of Current or Ex-Partner: No    Emotionally Abused: No    Physically Abused: No    Sexually Abused: No    Review of Systems: See HPI, otherwise negative ROS  Physical Exam: BP 105/62   Pulse 75   Temp (!) 97 F (36.1 C) (Temporal)   Resp 20   Ht 5\' 5"  (1.651 m)   Wt 72.6 kg   SpO2 95%   BMI 26.63 kg/m  General:   Alert, cooperative in NAD Head:  Normocephalic and atraumatic. Respiratory:  Normal work of breathing. Cardiovascular:  RRR  Impression/Plan: [name] is here for  cataract surgery.  Risks, benefits, limitations, and alternatives regarding cataract surgery have been reviewed with the patient.  Questions have been answered.  All parties agreeable.   Willey Blade, MD  06/25/2023, 7:51 AM

## 2023-06-25 NOTE — Op Note (Signed)
 OPERATIVE NOTE  Ann Pena [id number] 06/25/2023   PREOPERATIVE DIAGNOSIS:  Nuclear sclerotic cataract left eye.  H25.12   POSTOPERATIVE DIAGNOSIS:    Nuclear sclerotic cataract left eye.     PROCEDURE:  Phacoemusification with posterior chamber intraocular lens placement of the left eye   LENS:   Implant Name Type Inv. Item Serial No. Manufacturer Lot No. LRB No. Used Action  LENS IOL TECNIS EYHANCE 15.5 - [id number] Intraocular Lens LENS IOL TECNIS EYHANCE 15.5 [id number] [employer]  Left 1 Implanted      Procedure(s): PHACOEMULSIFICATION, CATARACT, WITH IOL INSERTION 2.52 00:22.4 (Left)  SURGEON:  Willey Blade, MD, MPH   ANESTHESIA:  Topical with tetracaine drops augmented with 1% preservative-free intracameral lidocaine.  ESTIMATED BLOOD LOSS: <1 mL   COMPLICATIONS:  None.   DESCRIPTION OF PROCEDURE:  The patient was identified in the holding room and transported to the operating room and placed in the supine position under the operating microscope.  The left eye was identified as the operative eye and it was prepped and draped in the usual sterile ophthalmic fashion.   A 1.0 millimeter clear-corneal paracentesis was made at the 5:00 position. 0.5 ml of preservative-free 1% lidocaine with epinephrine was injected into the anterior chamber.  The anterior chamber was filled with viscoelastic.  A 2.4 millimeter keratome was used to make a near-clear corneal incision at the 2:00 position.  A curvilinear capsulorrhexis was made with a cystotome and capsulorrhexis forceps.  Balanced salt solution was used to hydrodissect and hydrodelineate the nucleus.   Phacoemulsification was then used in stop and chop fashion to remove the lens nucleus and epinucleus.  The remaining cortex was then removed using the irrigation and aspiration handpiece. Viscoelastic was then placed into the capsular bag to distend it for lens placement.  A lens was then injected into the capsular bag.  The  remaining viscoelastic was aspirated.   Wounds were hydrated with balanced salt solution.  The anterior chamber was inflated to a physiologic pressure with balanced salt solution.   Intracameral vigamox 0.1 mL undiltued was injected into the eye and a drop placed onto the ocular surface.  No wound leaks were noted.  The patient was taken to the recovery room in stable condition without complications of anesthesia or surgery  Willey Blade 06/25/2023, 8:14 AM

## 2023-06-25 NOTE — Discharge Instructions (Signed)

## 2023-06-25 NOTE — Anesthesia Postprocedure Evaluation (Signed)
 Anesthesia Post Note  Patient: Ann Pena  Procedure(s) Performed: PHACOEMULSIFICATION, CATARACT, WITH IOL INSERTION 2.52 00:22.4 (Left: Eye)  Patient location during evaluation: PACU Anesthesia Type: MAC Level of consciousness: awake and alert Pain management: pain level controlled Vital Signs Assessment: post-procedure vital signs reviewed and stable Respiratory status: spontaneous breathing, nonlabored ventilation, respiratory function stable and patient connected to nasal cannula oxygen Cardiovascular status: stable and blood pressure returned to baseline Postop Assessment: no apparent nausea or vomiting Anesthetic complications: no   No notable events documented.   Last Vitals:  Vitals:   06/25/23 0816 06/25/23 0821  BP: 110/63 120/65  Pulse: 66 64  Resp: (!) 23 13  Temp: (!) 36.4 C 36.4 C  SpO2: 94% 95%    Last Pain:  Vitals:   06/25/23 0821  TempSrc:   PainSc: 0-No pain                 Marisue Humble

## 2023-06-26 ENCOUNTER — Encounter: Payer: Self-pay | Admitting: Family Medicine

## 2023-06-28 ENCOUNTER — Encounter: Payer: Self-pay | Admitting: Family Medicine

## 2023-06-28 ENCOUNTER — Other Ambulatory Visit: Payer: Self-pay | Admitting: Family Medicine

## 2023-06-28 DIAGNOSIS — N644 Mastodynia: Secondary | ICD-10-CM

## 2023-06-28 DIAGNOSIS — N63 Unspecified lump in unspecified breast: Secondary | ICD-10-CM

## 2023-07-02 NOTE — Anesthesia Preprocedure Evaluation (Addendum)
 Anesthesia Evaluation  Patient identified by MRN, date of birth, ID band Patient awake    Reviewed: Allergy & Precautions, H&P , NPO status , Patient's Chart, lab work & pertinent test results  Airway Mallampati: III  TM Distance: <3 FB Neck ROM: Full    Dental no notable dental hx. (+) Edentulous Upper, Edentulous Lower   Pulmonary neg pulmonary ROS, shortness of breath, asthma , Current Smoker   Pulmonary exam normal breath sounds clear to auscultation       Cardiovascular hypertension, + DOE  negative cardio ROS Normal cardiovascular exam+ Valvular Problems/Murmurs  Rhythm:Regular Rate:Normal  01-30-23 1.          Chest pain, risk factors current smoker, hyperlipidemia.  Get echo, get coronary CTA.  Start aspirin 81 mg daily. 2.         Hyperlipidemia, LDL over 200 indicating likely FH.  Not tolerant to statins.  Start Zetia 10 mg daily, start PCSK9. 3.Current smoker, smoking cessation advised. 4.         Dyspnea on exertion, cardiac workup with echo and CTA as above.  If unrevealing, plan to refer to pulmonary medicine for COPD eval due to 50+ years of smoking.  Deconditioning, depression may also be contributing.   Follow-up after cardiac testing.     06-06-23 1. Left ventricular ejection fraction, by estimation, is 55 to 60%. Left  ventricular ejection fraction by 3D volume is 54 %. The left ventricle has  normal function. The left ventricle has no regional wall motion  abnormalities. Left ventricular diastolic   parameters were normal.   2. Right ventricular systolic function is normal. The right ventricular  size is normal.   3. The mitral valve is normal in structure. Mild mitral valve  regurgitation. No evidence of mitral stenosis.   4. The aortic valve has an indeterminant number of cusps. Aortic valve  regurgitation is not visualized. No aortic stenosis is present.   5. The inferior vena cava is normal in size with  greater than 50%  respiratory variability, suggesting right atrial pressure of 3 mmHg.             Neuro/Psych  Headaches PSYCHIATRIC DISORDERS Anxiety Depression     Neuromuscular disease negative neurological ROS  negative psych ROS   GI/Hepatic negative GI ROS, Neg liver ROS, hiatal hernia,GERD  ,,  Endo/Other  negative endocrine ROS    Renal/GU Renal diseasenegative Renal ROS  negative genitourinary   Musculoskeletal negative musculoskeletal ROS (+)    Abdominal   Peds negative pediatric ROS (+)  Hematology negative hematology ROS (+)   Anesthesia Other Findings Previous cataract surgery 06-25-23 Dr. 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 Pain in joint, lower leg  Pain in limb Panic disorder without agoraphobia Posttraumatic stress disorder Shortness of breath  Chest pain, unspecified Anxiety state, unspecified  Syncope and collapse Asthma Other and unspecified hyperlipidemia Hypertension  Aortic calcification (HCC) GERD (gastroesophageal reflux disease) Murmur H/O endoscopy  Prediabetes Screening for malignant neoplasm of breast Wears dentures Mild mitral regurgitation by prior echocardiogram  Precordial pain DOE (dyspnea on exertion)     Reproductive/Obstetrics negative OB ROS                             Anesthesia Physical Anesthesia Plan  ASA: 3  Anesthesia Plan: MAC   Post-op Pain Management:    Induction: Intravenous  PONV Risk Score and Plan:   Airway Management Planned: Natural Airway and Nasal Cannula  Additional Equipment:   Intra-op Plan:   Post-operative Plan:   Informed Consent: I have reviewed the patients History and Physical, chart, labs and discussed the procedure including the risks,  benefits and alternatives for the proposed anesthesia with the patient or authorized representative who has indicated his/her understanding and acceptance.     Dental Advisory Given  Plan Discussed with: Anesthesiologist, CRNA and Surgeon  Anesthesia Plan Comments: (Patient consented for risks of anesthesia including but not limited to:  - adverse reactions to medications - damage to eyes, teeth, lips or other oral mucosa - nerve damage due to positioning  - sore throat or hoarseness - Damage to heart, brain, nerves, lungs, other parts of body or loss of life  Patient voiced understanding and assent.)        Anesthesia Quick Evaluation

## 2023-07-05 NOTE — Discharge Instructions (Signed)

## 2023-07-09 ENCOUNTER — Other Ambulatory Visit: Payer: Self-pay

## 2023-07-09 ENCOUNTER — Ambulatory Visit: Payer: Self-pay | Admitting: Anesthesiology

## 2023-07-09 ENCOUNTER — Encounter
Admission: RE | Disposition: A | Discharge status: Home / Self Care | Payer: Self-pay | Source: Home / Self Care | Attending: Ophthalmology

## 2023-07-09 ENCOUNTER — Encounter: Payer: Self-pay | Admitting: Ophthalmology

## 2023-07-09 ENCOUNTER — Ambulatory Visit
Admission: RE | Admit: 2023-07-09 | Discharge: 2023-07-09 | Disposition: A | Discharge status: Home / Self Care | Attending: Ophthalmology | Admitting: Ophthalmology

## 2023-07-09 DIAGNOSIS — Z8249 Family history of ischemic heart disease and other diseases of the circulatory system: Secondary | ICD-10-CM | POA: Insufficient documentation

## 2023-07-09 DIAGNOSIS — G709 Myoneural disorder, unspecified: Secondary | ICD-10-CM | POA: Diagnosis not present

## 2023-07-09 DIAGNOSIS — E785 Hyperlipidemia, unspecified: Secondary | ICD-10-CM | POA: Diagnosis not present

## 2023-07-09 DIAGNOSIS — J45909 Unspecified asthma, uncomplicated: Secondary | ICD-10-CM | POA: Insufficient documentation

## 2023-07-09 DIAGNOSIS — I129 Hypertensive chronic kidney disease with stage 1 through stage 4 chronic kidney disease, or unspecified chronic kidney disease: Secondary | ICD-10-CM | POA: Diagnosis not present

## 2023-07-09 DIAGNOSIS — K219 Gastro-esophageal reflux disease without esophagitis: Secondary | ICD-10-CM | POA: Diagnosis not present

## 2023-07-09 DIAGNOSIS — F418 Other specified anxiety disorders: Secondary | ICD-10-CM | POA: Diagnosis not present

## 2023-07-09 DIAGNOSIS — H2511 Age-related nuclear cataract, right eye: Secondary | ICD-10-CM | POA: Insufficient documentation

## 2023-07-09 DIAGNOSIS — F17210 Nicotine dependence, cigarettes, uncomplicated: Secondary | ICD-10-CM | POA: Diagnosis not present

## 2023-07-09 DIAGNOSIS — K449 Diaphragmatic hernia without obstruction or gangrene: Secondary | ICD-10-CM | POA: Insufficient documentation

## 2023-07-09 DIAGNOSIS — N183 Chronic kidney disease, stage 3 unspecified: Secondary | ICD-10-CM | POA: Insufficient documentation

## 2023-07-09 DIAGNOSIS — I340 Nonrheumatic mitral (valve) insufficiency: Secondary | ICD-10-CM | POA: Diagnosis not present

## 2023-07-09 DIAGNOSIS — R011 Cardiac murmur, unspecified: Secondary | ICD-10-CM | POA: Diagnosis not present

## 2023-07-09 HISTORY — PX: CATARACT EXTRACTION W/PHACO: SHX586

## 2023-07-09 SURGERY — PHACOEMULSIFICATION, CATARACT, WITH IOL INSERTION
Anesthesia: Monitor Anesthesia Care | Site: Eye | Laterality: Right

## 2023-07-09 MED ORDER — FENTANYL CITRATE (PF) 100 MCG/2ML IJ SOLN
INTRAMUSCULAR | Status: AC
  Filled 2023-07-09: qty 2

## 2023-07-09 MED ORDER — ARMC OPHTHALMIC DILATING DROPS
1.0000 | OPHTHALMIC | Status: DC | PRN
  Administered 2023-07-09 (×3): 1 via OPHTHALMIC

## 2023-07-09 MED ORDER — SIGHTPATH DOSE#1 BSS IO SOLN
INTRAOCULAR | Status: DC | PRN
  Administered 2023-07-09: 74 mL via OPHTHALMIC

## 2023-07-09 MED ORDER — SIGHTPATH DOSE#1 BSS IO SOLN
INTRAOCULAR | Status: DC | PRN
  Administered 2023-07-09: 15 mL via INTRAOCULAR

## 2023-07-09 MED ORDER — SIGHTPATH DOSE#1 NA HYALUR & NA CHOND-NA HYALUR IO KIT
PACK | INTRAOCULAR | Status: DC | PRN
Start: 2023-07-09 — End: 2023-07-09
  Administered 2023-07-09: 1 via OPHTHALMIC

## 2023-07-09 MED ORDER — POLYMYXIN B-TRIMETHOPRIM 10000-0.1 UNIT/ML-% OP SOLN
OPHTHALMIC | Status: DC | PRN
  Administered 2023-07-09: 1 [drp] via OPHTHALMIC

## 2023-07-09 MED ORDER — LIDOCAINE HCL (PF) 2 % IJ SOLN
INTRAOCULAR | Status: DC | PRN
  Administered 2023-07-09: 4 mL via INTRAOCULAR

## 2023-07-09 MED ORDER — TETRACAINE HCL 0.5 % OP SOLN
1.0000 [drp] | OPHTHALMIC | Status: DC | PRN
  Administered 2023-07-09 (×3): 1 [drp] via OPHTHALMIC

## 2023-07-09 MED ORDER — MIDAZOLAM HCL 2 MG/2ML IJ SOLN
INTRAMUSCULAR | Status: DC | PRN
  Administered 2023-07-09: 2 mg via INTRAVENOUS

## 2023-07-09 MED ORDER — MIDAZOLAM HCL 2 MG/2ML IJ SOLN
INTRAMUSCULAR | Status: AC
  Filled 2023-07-09: qty 2

## 2023-07-09 MED ORDER — FENTANYL CITRATE (PF) 100 MCG/2ML IJ SOLN
INTRAMUSCULAR | Status: DC | PRN
  Administered 2023-07-09: 50 ug via INTRAVENOUS

## 2023-07-09 SURGICAL SUPPLY — 12 items
CATARACT SUITE SIGHTPATH (MISCELLANEOUS) ×1 IMPLANT
DISSECTOR HYDRO NUCLEUS 50X22 (MISCELLANEOUS) ×1 IMPLANT
FEE CATARACT SUITE SIGHTPATH (MISCELLANEOUS) ×1 IMPLANT
GLOVE PI ULTRA LF STRL 7.5 (GLOVE) ×1 IMPLANT
GLOVE SURG POLYISOPRENE 8.5 (GLOVE) ×1 IMPLANT
GLOVE SURG PROTEXIS BL SZ6.5 (GLOVE) ×1 IMPLANT
GLOVE SURG SYN 6.5 PF PI BL (GLOVE) ×1 IMPLANT
GLOVE SURG SYN 8.5 PF PI BL (GLOVE) ×1 IMPLANT
LENS IOL TECNIS EYHANCE 15.5 (Intraocular Lens) IMPLANT
NDL FILTER BLUNT 18X1 1/2 (NEEDLE) ×1 IMPLANT
NEEDLE FILTER BLUNT 18X1 1/2 (NEEDLE) ×1 IMPLANT
SYR 3ML LL SCALE MARK (SYRINGE) ×1 IMPLANT

## 2023-07-09 NOTE — Op Note (Signed)
 OPERATIVE NOTE  Ann Pena [id number] 07/09/2023   PREOPERATIVE DIAGNOSIS:  Nuclear sclerotic cataract right eye.  H25.11   POSTOPERATIVE DIAGNOSIS:    Nuclear sclerotic cataract right eye.     PROCEDURE:  Phacoemusification with posterior chamber intraocular lens placement of the right eye   LENS:   Implant Name Type Inv. Item Serial No. Manufacturer Lot No. LRB No. Used Action  LENS IOL TECNIS EYHANCE 15.5 - [id number] Intraocular Lens LENS IOL TECNIS EYHANCE 15.5 [id number] [employer]  Right 1 Implanted       Procedure(s): PHACOEMULSIFICATION, CATARACT, WITH IOL INSERTION 5.77 00:41.8 (Right)  SURGEON:  Willey Blade, MD, MPH  ANESTHESIOLOGIST: Anesthesiologist: Marisue Humble, MD CRNA: Andee Poles, CRNA   ANESTHESIA:  Topical with tetracaine drops augmented with 1% preservative-free intracameral lidocaine.  ESTIMATED BLOOD LOSS: less than 1 mL.   COMPLICATIONS:  None.   DESCRIPTION OF PROCEDURE:  The patient was identified in the holding room and transported to the operating room and placed in the supine position under the operating microscope.  The right eye was identified as the operative eye and it was prepped and draped in the usual sterile ophthalmic fashion.   A 1.0 millimeter clear-corneal paracentesis was made at the 10:30 position. 0.5 ml of preservative-free 1% lidocaine with epinephrine was injected into the anterior chamber.  The anterior chamber was filled with viscoelastic.  A 2.4 millimeter keratome was used to make a near-clear corneal incision at the 8:00 position.  A curvilinear capsulorrhexis was made with a cystotome and capsulorrhexis forceps.  Balanced salt solution was used to hydrodissect and hydrodelineate the nucleus.   Phacoemulsification was then used in stop and chop fashion to remove the lens nucleus and epinucleus.  The remaining cortex was then removed using the irrigation and aspiration handpiece. Viscoelastic was then placed into the  capsular bag to distend it for lens placement.  A lens was then injected into the capsular bag.  The remaining viscoelastic was aspirated.   Wounds were hydrated with balanced salt solution.  The anterior chamber was inflated to a physiologic pressure with balanced salt solution.   Intracameral vigamox 0.1 mL undiluted was injected into the eye and a drop placed onto the ocular surface.  No wound leaks were noted.  The patient was taken to the recovery room in stable condition without complications of anesthesia or surgery  Willey Blade 07/09/2023, 10:38 AM

## 2023-07-09 NOTE — Anesthesia Postprocedure Evaluation (Signed)
 Anesthesia Post Note  Patient: Ann Pena  Procedure(s) Performed: PHACOEMULSIFICATION, CATARACT, WITH IOL INSERTION 5.77 00:41.8 (Right: Eye)  Patient location during evaluation: PACU Anesthesia Type: MAC Level of consciousness: awake and alert Pain management: pain level controlled Vital Signs Assessment: post-procedure vital signs reviewed and stable Respiratory status: spontaneous breathing, nonlabored ventilation, respiratory function stable and patient connected to nasal cannula oxygen Cardiovascular status: stable and blood pressure returned to baseline Postop Assessment: no apparent nausea or vomiting Anesthetic complications: no   No notable events documented.   Last Vitals:  Vitals:   07/09/23 1044 07/09/23 1045  BP: 133/73 133/73  Pulse: 64   Resp: 13 19  Temp: (!) 36.1 C   SpO2: 96%     Last Pain:  Vitals:   07/09/23 1044  TempSrc:   PainSc: 0-No pain                 Daune Colgate C Carleah Yablonski

## 2023-07-09 NOTE — Transfer of Care (Signed)
 Immediate Anesthesia Transfer of Care Note  Patient: Ann Pena  Procedure(s) Performed: PHACOEMULSIFICATION, CATARACT, WITH IOL INSERTION 5.77 00:41.8 (Right: Eye)  Patient Location: PACU  Anesthesia Type: MAC  Level of Consciousness: awake, alert  and patient cooperative  Airway and Oxygen Therapy: Patient Spontanous Breathing and Patient connected to supplemental oxygen  Post-op Assessment: Post-op Vital signs reviewed, Patient's Cardiovascular Status Stable, Respiratory Function Stable, Patent Airway and No signs of Nausea or vomiting  Post-op Vital Signs: Reviewed and stable  Complications: No notable events documented.

## 2023-07-09 NOTE — H&P (Signed)
 [facility]   Primary Care Physician:  Leanna Sato, MD Ophthalmologist: Dr. Willey Blade  Pre-Procedure History & Physical: HPI:  Ann Pena is a 63 y.o. female here for cataract surgery.   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 Kathie Rhodes. Iftikhar--LA Grade B reflux esophagitis. Hiatus hernia. Gastritis. Overview:  Dr. Kathie Rhodes. Iftikhar--LA Grade B reflux esophagitis. Hiatus hernia. Gastritis.   Hypertension 03/18/2012   Infective otitis externa, unspecified    Mild mitral regurgitation by prior echocardiogram    Murmur 10/17/2013   Obesity, unspecified    Other and unspecified hyperlipidemia    Other nonspecific abnormal serum enzyme levels    Pain in joint, lower leg    Pain in limb    Panic disorder without agoraphobia    Posttraumatic stress disorder    Precordial pain    Prediabetes 09/20/2017   Screening for malignant neoplasm of breast 04/04/2012   Shortness of breath    Syncope and collapse    Wears dentures    full upper and lower    Past Surgical History:  Procedure Laterality Date   CATARACT EXTRACTION W/PHACO Left 06/25/2023   Procedure: PHACOEMULSIFICATION, CATARACT, WITH IOL INSERTION 2.52 00:22.4;  Surgeon: Nevada Crane, MD;  Location: [facility];  Service: Ophthalmology;  Laterality: Left;   CHOLECYSTECTOMY N/A 10/15/2017   Procedure: LAPAROSCOPIC CHOLECYSTECTOMY;  Surgeon: Henrene Dodge, MD;  Location: [facility];  Service: General;  Laterality: N/A;   SHOULDER SURGERY     TOTAL ABDOMINAL HYSTERECTOMY  2000    Prior to Admission medications   Medication Sig Start Date End Date Taking?  Authorizing Provider  busPIRone (BUSPAR) 5 MG tablet Take 5 mg by mouth 2 (two) times daily.   Yes [provider]  desvenlafaxine (PRISTIQ) 25 MG 24 hr tablet Take 25 mg by mouth daily.   Yes [provider]  esomeprazole (NEXIUM) 20 MG capsule Take 20 mg by mouth daily at 12 noon.   Yes [provider]  mirtazapine (REMERON) 45 MG tablet Take 45 mg by mouth at bedtime.    Yes [provider]  QUEtiapine (SEROQUEL XR) 300 MG 24 hr tablet Take 300 mg by mouth at bedtime.   Yes [provider]  aspirin EC 81 MG tablet Take 1 tablet (81 mg total) by mouth daily. Swallow whole. Patient not taking: Reported on 06/19/2023 01/30/23   Debbe Odea, MD  ezetimibe (ZETIA) 10 MG tablet Take 1 tablet (10 mg total) by mouth daily. Patient not taking: Reported on 06/19/2023 01/30/23   Debbe Odea, MD    Allergies as of 06/13/2023 - Review Complete 02/07/2023  Allergen Reaction Noted   Yellow jacket venom [bee venom] Anaphylaxis and Swelling 10/11/2017   Codeine  08/26/2021   Penicillins Itching, Swelling, and Other (See Comments) 08/14/2013   Sulfa antibiotics Nausea And Vomiting 08/14/2013    Family History  Problem Relation Age of Onset   Hypertension Mother    COPD Mother    Heart disease Mother    Stroke Mother    Dementia Mother    Hypertension Father  Bone cancer Father    Heart attack Father    Heart disease Father    Thyroid disease Sister    Breast cancer Neg Hx     Social History   Socioeconomic History   Marital status: Divorced    Spouse name: Not on file   Number of children: 1   Years of education: Not on file   Highest education level: 12th grade  Occupational History   Occupation: Disabled  Tobacco Use   Smoking status: Every Day    Current packs/day: 0.50    Average packs/day: 0.5 packs/day for 46.3 years (23.1 ttl pk-yrs)    Types: Cigarettes    Start date: 1979   Smokeless tobacco: Never  Vaping Use    Vaping status: Never Used  Substance and Sexual Activity   Alcohol use: No   Drug use: No   Sexual activity: Not Currently    Birth control/protection: Abstinence  Other Topics Concern   Not on file  Social History Narrative   Not on file   Social Drivers of Health   Financial Resource Strain: Low Risk  (08/21/2017)   Overall Financial Resource Strain (CARDIA)    Difficulty of Paying Living Expenses: Not hard at all  Food Insecurity: No Food Insecurity (08/21/2017)   Hunger Vital Sign    Worried About Running Out of Food in the Last Year: Never true    Ran Out of Food in the Last Year: Never true  Transportation Needs: No Transportation Needs (08/21/2017)   PRAPARE - Administrator, Civil Service (Medical): No    Lack of Transportation (Non-Medical): No  Physical Activity: Inactive (08/21/2017)   Exercise Vital Sign    Days of Exercise per Week: 0 days    Minutes of Exercise per Session: 0 min  Stress: Stress Concern Present (08/21/2017)   [employer] of Occupational Health - Occupational Stress Questionnaire    Feeling of Stress : Very much  Social Connections: Unknown (08/21/2017)   Social Connection and Isolation Panel [NHANES]    Frequency of Communication with Friends and Family: Patient declined    Frequency of Social Gatherings with Friends and Family: Patient declined    Attends Religious Services: Patient declined    Database administrator or Organizations: Patient declined    Attends Banker Meetings: Patient declined    Marital Status: Divorced  Catering manager Violence: Not At Risk (08/21/2017)   Humiliation, Afraid, Rape, and Kick questionnaire    Fear of Current or Ex-Partner: No    Emotionally Abused: No    Physically Abused: No    Sexually Abused: No    Review of Systems: See HPI, otherwise negative ROS  Physical Exam: BP (!) 96/57   Pulse 76   Temp (!) 97.5 F (36.4 C) (Temporal)   Resp 14   Ht 5\' 5"  (1.651 m)   Wt 72.1  kg   SpO2 96%   BMI 26.46 kg/m  General:   Alert, cooperative. Head:  Normocephalic and atraumatic. Respiratory:  Normal work of breathing. Cardiovascular:  NAD  Impression/Plan: Ann Pena is here for cataract surgery.  Risks, benefits, limitations, and alternatives regarding cataract surgery have been reviewed with the patient.  Questions have been answered.  All parties agreeable.   Willey Blade, MD  07/09/2023, 10:11 AM

## 2023-07-12 ENCOUNTER — Ambulatory Visit
Admission: RE | Admit: 2023-07-12 | Discharge: 2023-07-12 | Disposition: A | Discharge status: Home / Self Care | Source: Ambulatory Visit | Attending: Family Medicine | Admitting: Family Medicine

## 2023-07-12 DIAGNOSIS — N644 Mastodynia: Secondary | ICD-10-CM

## 2023-07-12 DIAGNOSIS — N63 Unspecified lump in unspecified breast: Secondary | ICD-10-CM | POA: Insufficient documentation

## 2023-08-17 ENCOUNTER — Other Ambulatory Visit: Payer: Self-pay

## 2023-08-17 DIAGNOSIS — E785 Hyperlipidemia, unspecified: Secondary | ICD-10-CM

## 2023-08-17 MED ORDER — EZETIMIBE 10 MG PO TABS: 10.0000 mg | ORAL_TABLET | Freq: Every day | ORAL | 1 refills | Status: DC

## 2023-12-20 ENCOUNTER — Other Ambulatory Visit: Payer: Self-pay | Admitting: Family Medicine

## 2023-12-20 DIAGNOSIS — Z1231 Encounter for screening mammogram for malignant neoplasm of breast: Secondary | ICD-10-CM

## 2023-12-20 DIAGNOSIS — N63 Unspecified lump in unspecified breast: Secondary | ICD-10-CM

## 2024-01-02 ENCOUNTER — Encounter

## 2024-01-02 ENCOUNTER — Other Ambulatory Visit

## 2024-02-14 ENCOUNTER — Other Ambulatory Visit: Payer: Self-pay | Admitting: Cardiology

## 2024-02-14 DIAGNOSIS — E785 Hyperlipidemia, unspecified: Secondary | ICD-10-CM
